# Patient Record
Sex: Male | Born: 1980 | Race: White | Hispanic: No | Marital: Married | Smoking: Current every day smoker
Health system: Southern US, Community
[De-identification: ages and names within clinical notes are randomized; demographics above are authoritative.]

## PROBLEM LIST (undated history)

## (undated) DIAGNOSIS — F329 Major depressive disorder, single episode, unspecified: Secondary | ICD-10-CM

## (undated) DIAGNOSIS — E079 Disorder of thyroid, unspecified: Secondary | ICD-10-CM

## (undated) DIAGNOSIS — F32A Depression, unspecified: Secondary | ICD-10-CM

## (undated) DIAGNOSIS — I10 Essential (primary) hypertension: Secondary | ICD-10-CM

## (undated) HISTORY — DX: Major depressive disorder, single episode, unspecified: F32.9

## (undated) HISTORY — DX: Depression, unspecified: F32.A

## (undated) HISTORY — DX: Disorder of thyroid, unspecified: E07.9

## (undated) HISTORY — DX: Essential (primary) hypertension: I10

---
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
## 2004-03-10 ENCOUNTER — Emergency Department: Payer: Self-pay | Admitting: Emergency Medicine

## 2009-09-04 ENCOUNTER — Emergency Department: Payer: Self-pay | Admitting: Emergency Medicine

## 2010-11-26 ENCOUNTER — Ambulatory Visit: Payer: Self-pay

## 2015-02-12 ENCOUNTER — Ambulatory Visit: Payer: Self-pay | Admitting: Physician Assistant

## 2015-02-19 ENCOUNTER — Ambulatory Visit (INDEPENDENT_AMBULATORY_CARE_PROVIDER_SITE_OTHER): Payer: Managed Care, Other (non HMO) | Admitting: Physician Assistant

## 2015-02-19 ENCOUNTER — Encounter: Payer: Self-pay | Admitting: Physician Assistant

## 2015-02-19 VITALS — BP 158/100 | HR 70 | Temp 98.5°F | Resp 18 | Ht 72.0 in | Wt 205.4 lb

## 2015-02-19 DIAGNOSIS — F32A Depression, unspecified: Secondary | ICD-10-CM | POA: Insufficient documentation

## 2015-02-19 DIAGNOSIS — F419 Anxiety disorder, unspecified: Secondary | ICD-10-CM | POA: Diagnosis not present

## 2015-02-19 DIAGNOSIS — F329 Major depressive disorder, single episode, unspecified: Secondary | ICD-10-CM | POA: Diagnosis not present

## 2015-02-19 DIAGNOSIS — I10 Essential (primary) hypertension: Secondary | ICD-10-CM

## 2015-02-19 DIAGNOSIS — Z7689 Persons encountering health services in other specified circumstances: Secondary | ICD-10-CM

## 2015-02-19 DIAGNOSIS — Z7189 Other specified counseling: Secondary | ICD-10-CM | POA: Diagnosis not present

## 2015-02-19 DIAGNOSIS — M7022 Olecranon bursitis, left elbow: Secondary | ICD-10-CM

## 2015-02-19 DIAGNOSIS — E079 Disorder of thyroid, unspecified: Secondary | ICD-10-CM

## 2015-02-19 MED ORDER — CITALOPRAM HYDROBROMIDE 20 MG PO TABS: 20.0000 mg | ORAL_TABLET | Freq: Every day | ORAL | Status: DC

## 2015-02-19 MED ORDER — ALPRAZOLAM 0.25 MG PO TABS: 0.2500 mg | ORAL_TABLET | Freq: Three times a day (TID) | ORAL | Status: DC | PRN

## 2015-02-19 NOTE — Patient Instructions (Signed)
Hypertension Hypertension, commonly called high blood pressure, is when the force of blood pumping through your arteries is too strong. Your arteries are the blood vessels that carry blood from your heart throughout your body. A blood pressure reading consists of a higher number over a lower number, such as 110/72. The higher number (systolic) is the pressure inside your arteries when your heart pumps. The lower number (diastolic) is the pressure inside your arteries when your heart relaxes. Ideally you want your blood pressure below 120/80. Hypertension forces your heart to work harder to pump blood. Your arteries may become narrow or stiff. Having untreated or uncontrolled hypertension can cause heart attack, stroke, kidney disease, and other problems. RISK FACTORS Some risk factors for high blood pressure are controllable. Others are not.  Risk factors you cannot control include:   Race. You may be at higher risk if you are African American.  Age. Risk increases with age.  Gender. Men are at higher risk than women before age 45 years. After age 65, women are at higher risk than men. Risk factors you can control include:  Not getting enough exercise or physical activity.  Being overweight.  Getting too much fat, sugar, calories, or salt in your diet.  Drinking too much alcohol. SIGNS AND SYMPTOMS Hypertension does not usually cause signs or symptoms. Extremely high blood pressure (hypertensive crisis) may cause headache, anxiety, shortness of breath, and nosebleed. DIAGNOSIS To check if you have hypertension, your health care provider will measure your blood pressure while you are seated, with your arm held at the level of your heart. It should be measured at least twice using the same arm. Certain conditions can cause a difference in blood pressure between your right and left arms. A blood pressure reading that is higher than normal on one occasion does not mean that you need treatment. If  it is not clear whether you have high blood pressure, you may be asked to return on a different day to have your blood pressure checked again. Or, you may be asked to monitor your blood pressure at home for 1 or more weeks. TREATMENT Treating high blood pressure includes making lifestyle changes and possibly taking medicine. Living a healthy lifestyle can help lower high blood pressure. You may need to change some of your habits. Lifestyle changes may include:  Following the DASH diet. This diet is high in fruits, vegetables, and whole grains. It is low in salt, red meat, and added sugars.  Keep your sodium intake below 2,300 mg per day.  Getting at least 30-45 minutes of aerobic exercise at least 4 times per week.  Losing weight if necessary.  Not smoking.  Limiting alcoholic beverages.  Learning ways to reduce stress. Your health care provider may prescribe medicine if lifestyle changes are not enough to get your blood pressure under control, and if one of the following is true:  You are 18-59 years of age and your systolic blood pressure is above 140.  You are 60 years of age or older, and your systolic blood pressure is above 150.  Your diastolic blood pressure is above 90.  You have diabetes, and your systolic blood pressure is over 140 or your diastolic blood pressure is over 90.  You have kidney disease and your blood pressure is above 140/90.  You have heart disease and your blood pressure is above 140/90. Your personal target blood pressure may vary depending on your medical conditions, your age, and other factors. HOME CARE INSTRUCTIONS    Have your blood pressure rechecked as directed by your health care provider.   Take medicines only as directed by your health care provider. Follow the directions carefully. Blood pressure medicines must be taken as prescribed. The medicine does not work as well when you skip doses. Skipping doses also puts you at risk for  problems.  Do not smoke.   Monitor your blood pressure at home as directed by your health care provider. SEEK MEDICAL CARE IF:   You think you are having a reaction to medicines taken.  You have recurrent headaches or feel dizzy.  You have swelling in your ankles.  You have trouble with your vision. SEEK IMMEDIATE MEDICAL CARE IF:  You develop a severe headache or confusion.  You have unusual weakness, numbness, or feel faint.  You have severe chest or abdominal pain.  You vomit repeatedly.  You have trouble breathing. MAKE SURE YOU:   Understand these instructions.  Will watch your condition.  Will get help right away if you are not doing well or get worse.   This information is not intended to replace advice given to you by your health care provider. Make sure you discuss any questions you have with your health care provider.   Document Released: 12/20/2004 Document Revised: 05/06/2014 Document Reviewed: 10/12/2012 Elsevier Interactive Patient Education 2016 [employer] DASH Eating Plan DASH stands for "Dietary Approaches to Stop Hypertension." The DASH eating plan is a healthy eating plan that has been shown to reduce high blood pressure (hypertension). Additional health benefits may include reducing the risk of type 2 diabetes mellitus, heart disease, and stroke. The DASH eating plan may also help with weight loss. WHAT DO I NEED TO KNOW ABOUT THE DASH EATING PLAN? For the DASH eating plan, you will follow these general guidelines:  Choose foods with a percent daily value for sodium of less than 5% (as listed on the food label).  Use salt-free seasonings or herbs instead of table salt or sea salt.  Check with your health care provider or pharmacist before using salt substitutes.  Eat lower-sodium products, often labeled as "lower sodium" or "no salt added."  Eat fresh foods.  Eat more vegetables, fruits, and low-fat dairy products.  Choose whole grains.  Look for the word "whole" as the first word in the ingredient list.  Choose fish and skinless chicken or turkey more often than red meat. Limit fish, poultry, and meat to 6 oz (170 g) each day.  Limit sweets, desserts, sugars, and sugary drinks.  Choose heart-healthy fats.  Limit cheese to 1 oz (28 g) per day.  Eat more home-cooked food and less restaurant, buffet, and fast food.  Limit fried foods.  Cook foods using methods other than frying.  Limit canned vegetables. If you do use them, rinse them well to decrease the sodium.  When eating at a restaurant, ask that your food be prepared with less salt, or no salt if possible. WHAT FOODS CAN I EAT? Seek help from a dietitian for individual calorie needs. Grains Whole grain or whole wheat bread. Brown rice. Whole grain or whole wheat pasta. Quinoa, bulgur, and whole grain cereals. Low-sodium cereals. Corn or whole wheat flour tortillas. Whole grain cornbread. Whole grain crackers. Low-sodium crackers. Vegetables Fresh or frozen vegetables (raw, steamed, roasted, or grilled). Low-sodium or reduced-sodium tomato and vegetable juices. Low-sodium or reduced-sodium tomato sauce and paste. Low-sodium or reduced-sodium canned vegetables.  Fruits All fresh, canned (in natural juice), or frozen fruits. Meat and Other   Protein Products Ground beef (85% or leaner), grass-fed beef, or beef trimmed of fat. Skinless chicken or [place]. Ground chicken or [place]. Pork trimmed of fat. All fish and seafood. Eggs. Dried beans, peas, or lentils. Unsalted nuts and seeds. Unsalted canned beans. Dairy Low-fat dairy products, such as skim or 1% milk, 2% or reduced-fat cheeses, low-fat ricotta or cottage cheese, or plain low-fat yogurt. Low-sodium or reduced-sodium cheeses. Fats and Oils Tub margarines without trans fats. Light or reduced-fat mayonnaise and salad dressings (reduced sodium). Avocado. Safflower, olive, or canola oils. Natural peanut or almond  butter. Other Unsalted popcorn and pretzels. The items listed above may not be a complete list of recommended foods or beverages. Contact your dietitian for more options. WHAT FOODS ARE NOT RECOMMENDED? Grains White bread. White pasta. White rice. Refined cornbread. Bagels and croissants. Crackers that contain trans fat. Vegetables Creamed or fried vegetables. Vegetables in a cheese sauce. Regular canned vegetables. Regular canned tomato sauce and paste. Regular tomato and vegetable juices. Fruits Dried fruits. Canned fruit in light or heavy syrup. Fruit juice. Meat and Other Protein Products Fatty cuts of meat. Ribs, chicken wings, bacon, sausage, bologna, salami, chitterlings, fatback, hot dogs, bratwurst, and packaged luncheon meats. Salted nuts and seeds. Canned beans with salt. Dairy Whole or 2% milk, cream, half-and-half, and cream cheese. Whole-fat or sweetened yogurt. Full-fat cheeses or blue cheese. Nondairy creamers and whipped toppings. Processed cheese, cheese spreads, or cheese curds. Condiments Onion and garlic salt, seasoned salt, table salt, and sea salt. Canned and packaged gravies. Worcestershire sauce. Tartar sauce. Barbecue sauce. Teriyaki sauce. Soy sauce, including reduced sodium. Steak sauce. Fish sauce. Oyster sauce. Cocktail sauce. Horseradish. Ketchup and mustard. Meat flavorings and tenderizers. Bouillon cubes. Hot sauce. Tabasco sauce. Marinades. Taco seasonings. Relishes. Fats and Oils Butter, stick margarine, lard, shortening, ghee, and bacon fat. Coconut, palm kernel, or palm oils. Regular salad dressings. Other Pickles and olives. Salted popcorn and pretzels. The items listed above may not be a complete list of foods and beverages to avoid. Contact your dietitian for more information. WHERE CAN I FIND MORE INFORMATION? National [employer], [employer], [employer]: [employer]   This information is not intended to replace  advice given to you by your health care provider. Make sure you discuss any questions you have with your health care provider.   Document Released: 12/09/2010 Document Revised: 01/10/2014 Document Reviewed: 10/24/2012 Elsevier Interactive Patient Education 2016 [employer] Citalopram tablets What is this medicine? CITALOPRAM (sye TAL oh pram) is a medicine for depression. This medicine may be used for other purposes; ask your health care provider or pharmacist if you have questions. What should I tell my health care provider before I take this medicine? They need to know if you have any of these conditions: -bipolar disorder or a family history of bipolar disorder -diabetes -glaucoma -heart disease -history of irregular heartbeat -kidney or liver disease -low levels of magnesium or potassium in the blood -receiving electroconvulsive therapy -seizures (convulsions) -suicidal thoughts or a previous suicide attempt -an unusual or allergic reaction to citalopram, escitalopram, other medicines, foods, dyes, or preservatives -pregnant or trying to become pregnant -breast-feeding How should I use this medicine? Take this medicine by mouth with a glass of water. Follow the directions on the prescription label. You can take it with or without food. Take your medicine at regular intervals. Do not take your medicine more often than directed. Do not stop taking this medicine suddenly except upon the advice of your doctor. Stopping this  medicine too quickly may cause serious side effects or your condition may worsen. A special MedGuide will be given to you by the pharmacist with each prescription and refill. Be sure to read this information carefully each time. Talk to your pediatrician regarding the use of this medicine in children. Special care may be needed. Patients over 55 years old may have a stronger reaction and need a smaller dose. Overdosage: If you think you have taken too much of this  medicine contact a poison control center or emergency room at once. NOTE: This medicine is only for you. Do not share this medicine with others. What if I miss a dose? If you miss a dose, take it as soon as you can. If it is almost time for your next dose, take only that dose. Do not take double or extra doses. What may interact with this medicine? Do not take this medicine with any of the following medications: -certain medicines for fungal infections like fluconazole, itraconazole, ketoconazole, posaconazole, voriconazole -cisapride -dofetilide -dronedarone -escitalopram -linezolid -MAOIs like Carbex, Eldepryl, Marplan, Nardil, and Parnate -methylene blue (injected into a vein) -pimozide -thioridazine -ziprasidone This medicine may also interact with the following medications: -alcohol -aspirin and aspirin-like medicines -carbamazepine -certain medicines for depression, anxiety, or psychotic disturbances -certain medicines for infections like chloroquine, clarithromycin, erythromycin, furazolidone, isoniazid, pentamidine -certain medicines for migraine headaches like almotriptan, eletriptan, frovatriptan, naratriptan, rizatriptan, sumatriptan, zolmitriptan -certain medicines for sleep -certain medicines that treat or prevent blood clots like dalteparin, enoxaparin, warfarin -cimetidine -diuretics -fentanyl -lithium -methadone -metoprolol -NSAIDs, medicines for pain and inflammation, like ibuprofen or naproxen -omeprazole -other medicines that prolong the QT interval (cause an abnormal heart rhythm) -procarbazine -rasagiline -supplements like St. John's wort, kava kava, valerian -tramadol -tryptophan This list may not describe all possible interactions. Give your health care provider a list of all the medicines, herbs, non-prescription drugs, or dietary supplements you use. Also tell them if you smoke, drink alcohol, or use illegal drugs. Some items may interact with your  medicine. What should I watch for while using this medicine? Tell your doctor if your symptoms do not get better or if they get worse. Visit your doctor or health care professional for regular checks on your progress. Because it may take several weeks to see the full effects of this medicine, it is important to continue your treatment as prescribed by your doctor. Patients and their families should watch out for new or worsening thoughts of suicide or depression. Also watch out for sudden changes in feelings such as feeling anxious, agitated, panicky, irritable, hostile, aggressive, impulsive, severely restless, overly excited and hyperactive, or not being able to sleep. If this happens, especially at the beginning of treatment or after a change in dose, call your health care professional. Bonita Quin may get drowsy or dizzy. Do not drive, use machinery, or do anything that needs mental alertness until you know how this medicine affects you. Do not stand or sit up quickly, especially if you are an older patient. This reduces the risk of dizzy or fainting spells. Alcohol may interfere with the effect of this medicine. Avoid alcoholic drinks. Your mouth may get dry. Chewing sugarless gum or sucking hard candy, and drinking plenty of water will help. Contact your doctor if the problem does not go away or is severe. What side effects may I notice from receiving this medicine? Side effects that you should report to your doctor or health care professional as soon as possible: -allergic reactions like skin rash,  itching or hives, swelling of the face, lips, or tongue -chest pain -confusion -dizziness -fast, irregular heartbeat -fast talking and excited feelings or actions that are out of control -feeling faint or lightheaded, falls -hallucination, loss of contact with reality -seizures -shortness of breath -suicidal thoughts or other mood changes -unusual bleeding or bruising Side effects that usually do not  require medical attention (report to your doctor or health care professional if they continue or are bothersome): -blurred vision -change in appetite -change in sex drive or performance -headache -increased sweating -nausea -trouble sleeping This list may not describe all possible side effects. Call your doctor for medical advice about side effects. You may report side effects to FDA at 1-800-FDA-1088. Where should I keep my medicine? Keep out of reach of children. Store at room temperature between 15 and 30 degrees C (59 and 86 degrees F). Throw away any unused medicine after the expiration date. NOTE: This sheet is a summary. It may not cover all possible information. If you have questions about this medicine, talk to your doctor, pharmacist, or health care provider.    2016, Elsevier/[employer]. (2012-07-13 13:19:48)

## 2015-02-19 NOTE — Progress Notes (Signed)
Subjective:     Patient ID: Christian Strong., male   DOB: 03-09-1980, 35 y.o.   MRN: [id number]   Establish Patient Care Patient comes to office today as a new patient to establish patient care he states that he has not had a PCP in 20 years.  Anxiety Presents for initial visit. Onset was 1 to 4 weeks ago. The problem has been unchanged. Symptoms include depressed mood, hyperventilation, insomnia, irritability, malaise, muscle tension, nervous/anxious behavior, palpitations, panic and restlessness. Patient reports no chest pain, compulsions, confusion, decreased concentration, dizziness, dry mouth, excessive worry, feeling of choking, impotence, nausea, obsessions, shortness of breath or suicidal ideas. Symptoms occur occasionally. The severity of symptoms is moderate. The symptoms are aggravated by family issues (Wife is out of country). The quality of sleep is poor. Nighttime awakenings: none.   Risk factors include a major life event. His past medical history is significant for anxiety/panic attacks and depression. Prior compliance problems include difficulty with treatment plan (patient states that he was on medication for issue as a teenager).   Wife is working Designer, jewellery for the next 2 months. Gets stressed and anxious about her safety and when she travels. Also gets very anxious with raising 2 teenage daughters alone.  Scalp Problem Patient reports that he shaves his head and about a week ago. He states that when he shaved he cut himself deeply and now has a bald spot on his head that has not improved. Wants to have it examined.   Review of Systems  Constitutional: Positive for irritability.  HENT: Negative.   Eyes: Negative.   Respiratory: Negative.  Negative for shortness of breath.   Cardiovascular: Positive for palpitations. Negative for chest pain.  Gastrointestinal: Negative.  Negative for nausea.  Endocrine: Negative.   Genitourinary: Negative.  Negative for impotence.   Musculoskeletal: Positive for joint swelling (left elbow).  Skin: Positive for wound (scalp).  Allergic/Immunologic: Negative.   Neurological: Positive for numbness (patient reports that he feels numbness when having panic attracks). Negative for dizziness.  Hematological: Negative.   Psychiatric/Behavioral: Negative for suicidal ideas, confusion and decreased concentration. The patient is nervous/anxious and has insomnia.      Patient Active Problem List   Diagnosis Date Noted  . Depression 02/19/2015  . Hypertension 02/19/2015   Past Medical History  Diagnosis Date  . Thyroid disease   . Depression    No current outpatient prescriptions on file prior to visit.   No current facility-administered medications on file prior to visit.   Allergies no known allergies Past Surgical History  Procedure Laterality Date  . 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 Marital Status: Married    Spouse Name: N/A  . Number of Children: N/A  . Years of Education: N/A   Occupational History  . Not on file.   Social History Main Topics  . Smoking status: Current Every Day Smoker -- 0.50 packs/day    Types: Cigarettes  . Smokeless tobacco: Never Used  . Alcohol Use: 1.8 - 2.4 oz/week    3-4 Shots of liquor per week  . Drug Use: Yes     Comment: previous history of use   . Sexual Activity:    Partners: Female   Other Topics Concern  . Not on file   Social History Narrative  . No narrative on file   Family History  Problem Relation Age of Onset  . Heart attack Mother   .  Heart disease Mother   . Diabetes Mother   . Cancer Mother   . Colon polyps Mother      .result Filed Vitals:   02/19/15 1501  BP: 160/90  Pulse: 70  Temp: 98.5 F (36.9 C)  Resp: 18       Objective:   Physical Exam  Constitutional: He is oriented to person, place, and time. He appears well-developed and well-nourished.  HENT:  Head: Normocephalic and atraumatic.     Right Ear: Tympanic membrane, external ear and ear canal normal.  Left Ear: Tympanic membrane, external ear and ear canal normal.  Nose: Nose normal.  Mouth/Throat: Uvula is midline, oropharynx is clear and moist and mucous membranes are normal. No oropharyngeal exudate, posterior oropharyngeal edema or posterior oropharyngeal erythema.  Eyes: Conjunctivae and EOM are normal. Pupils are equal, round, and reactive to light. Right eye exhibits no discharge.  Neck: Normal range of motion. Neck supple. No tracheal deviation present. No thyromegaly present.  Cardiovascular: Normal rate, regular rhythm, normal heart sounds and intact distal pulses.   No murmur heard. Pulmonary/Chest: Effort normal and breath sounds normal. No respiratory distress. He has no wheezes. He has no rales. He exhibits no tenderness.  Abdominal: Soft. He exhibits no distension and no mass. There is no tenderness. There is no rebound and no guarding.  Musculoskeletal: Normal range of motion. He exhibits no edema or tenderness.       Right elbow: Normal.      Left elbow: He exhibits swelling (inflamed bursa). He exhibits normal range of motion and no effusion. No tenderness found.  Lymphadenopathy:    He has no cervical adenopathy.  Neurological: He is alert and oriented to person, place, and time. He has normal reflexes. No cranial nerve deficit. He exhibits normal muscle tone. Coordination normal.  Skin: Skin is warm and dry. No rash noted. No erythema.     Psychiatric: His speech is normal and behavior is normal. Judgment and thought content normal. His mood appears anxious. Cognition and memory are normal.  Vitals reviewed.      Assessment:     1. Establishing care with new doctor, encounter for   2. Depression   3. Thyroid disease   4. Essential hypertension   5. Acute anxiety   6. Bursitis of elbow, left       Plan:     1. Establishing care with new doctor, encounter for No previous PCP.  2.  Depression Worsening symptoms secondary to situation. Will give Celexa as below. He is to call the office if he has any adverse reactions to the medication as discussed. I will see him back in 4 weeks if he tolerates the medication to see how he is doing at that time. - citalopram (CELEXA) 20 MG tablet; Take 1 tablet (20 mg total) by mouth daily.  Dispense: 30 tablet; Refill: 0  3. Thyroid disease States that when he was a teenager he had a thyroid nodule and thinks they removed approximately 75% of his thyroid. He cannot remember which side that it was on. He has not had his thyroid levels checked since. Labs as below and follow-up pending these lab results. - TSH  4. Essential hypertension Blood pressure was elevated today in the office. I will check labs as below and follow-up pending these lab results. I do feel his blood pressure may be elevated secondary to acute anxiety. He was very nervous and anxious appearing on exam today. I did advise him to check  his blood pressure at home a couple of times and to write those readings down and bring those with him when he returns in 4 weeks. If blood pressure is elevated at home and is elevated in the office we'll start medication to lower blood pressure. - CBC w/Diff/Platelet - Comprehensive Metabolic Panel (CMET) - Lipid Profile  5. Acute anxiety Worsening situational anxiety secondary to his wife being overseas. Will give Xanax as below and follow-up in 4 weeks. He is to call the office if symptoms worsen in the meantime. - ALPRAZolam (XANAX) 0.25 MG tablet; Take 1 tablet (0.25 mg total) by mouth 3 (three) times daily as needed for anxiety.  Dispense: 90 tablet; Refill: 0  6. Bursitis of elbow, left He did have acute bursitis of the left elbow. There was no surrounding erythema or warmth to the area. Advised to continue ibuprofen 800 mg 3 times daily and continue icing his elbow. I did advise him to do this for 2 more weeks. And then intermittently  as needed. He is to call the office if it worsens or if signs of infection develop. If not I will see him back in 4 weeks to see if there's been any improvement. If not may consider referral to orthopedics for further evaluation and possible drainage.

## 2015-03-19 ENCOUNTER — Ambulatory Visit: Payer: Managed Care, Other (non HMO) | Admitting: Physician Assistant

## 2021-03-31 ENCOUNTER — Inpatient Hospital Stay: Payer: Commercial Managed Care - PPO

## 2021-03-31 ENCOUNTER — Emergency Department: Payer: Commercial Managed Care - PPO

## 2021-03-31 ENCOUNTER — Other Ambulatory Visit: Payer: Self-pay

## 2021-03-31 ENCOUNTER — Inpatient Hospital Stay
Admission: EM | Admit: 2021-03-31 | Discharge: 2021-04-03 | DRG: 071 | Disposition: A | Discharge status: Home / Self Care | Payer: Commercial Managed Care - PPO | Attending: Student | Admitting: Student

## 2021-03-31 DIAGNOSIS — N179 Acute kidney failure, unspecified: Secondary | ICD-10-CM | POA: Diagnosis present

## 2021-03-31 DIAGNOSIS — E0590 Thyrotoxicosis, unspecified without thyrotoxic crisis or storm: Secondary | ICD-10-CM | POA: Diagnosis present

## 2021-03-31 DIAGNOSIS — Z20822 Contact with and (suspected) exposure to covid-19: Secondary | ICD-10-CM | POA: Diagnosis present

## 2021-03-31 DIAGNOSIS — G4730 Sleep apnea, unspecified: Secondary | ICD-10-CM | POA: Diagnosis present

## 2021-03-31 DIAGNOSIS — F17210 Nicotine dependence, cigarettes, uncomplicated: Secondary | ICD-10-CM | POA: Diagnosis present

## 2021-03-31 DIAGNOSIS — F32A Depression, unspecified: Secondary | ICD-10-CM | POA: Diagnosis present

## 2021-03-31 DIAGNOSIS — J328 Other chronic sinusitis: Secondary | ICD-10-CM | POA: Diagnosis present

## 2021-03-31 DIAGNOSIS — Z8249 Family history of ischemic heart disease and other diseases of the circulatory system: Secondary | ICD-10-CM | POA: Diagnosis not present

## 2021-03-31 DIAGNOSIS — R451 Restlessness and agitation: Secondary | ICD-10-CM | POA: Diagnosis present

## 2021-03-31 DIAGNOSIS — I10 Essential (primary) hypertension: Secondary | ICD-10-CM | POA: Diagnosis present

## 2021-03-31 DIAGNOSIS — M6282 Rhabdomyolysis: Secondary | ICD-10-CM | POA: Diagnosis present

## 2021-03-31 DIAGNOSIS — F419 Anxiety disorder, unspecified: Secondary | ICD-10-CM | POA: Diagnosis present

## 2021-03-31 DIAGNOSIS — I6783 Posterior reversible encephalopathy syndrome: Secondary | ICD-10-CM | POA: Diagnosis present

## 2021-03-31 DIAGNOSIS — I161 Hypertensive emergency: Secondary | ICD-10-CM | POA: Diagnosis present

## 2021-03-31 DIAGNOSIS — Z79899 Other long term (current) drug therapy: Secondary | ICD-10-CM | POA: Diagnosis not present

## 2021-03-31 DIAGNOSIS — E872 Acidosis, unspecified: Secondary | ICD-10-CM | POA: Diagnosis present

## 2021-03-31 LAB — DIFFERENTIAL
Abs Immature Granulocytes: 0.19 10*3/uL — ABNORMAL HIGH (ref 0.00–0.07)
Basophils Absolute: 0.1 10*3/uL (ref 0.0–0.1)
Basophils Relative: 1 %
Eosinophils Absolute: 0 10*3/uL (ref 0.0–0.5)
Eosinophils Relative: 0 %
Immature Granulocytes: 1 %
Lymphocytes Relative: 11 %
Lymphs Abs: 1.6 10*3/uL (ref 0.7–4.0)
Monocytes Absolute: 0.5 10*3/uL (ref 0.1–1.0)
Monocytes Relative: 3 %
Neutro Abs: 13 10*3/uL — ABNORMAL HIGH (ref 1.7–7.7)
Neutrophils Relative %: 84 %

## 2021-03-31 LAB — URINALYSIS, ROUTINE W REFLEX MICROSCOPIC
Bilirubin Urine: NEGATIVE
Glucose, UA: NEGATIVE mg/dL
Ketones, ur: NEGATIVE mg/dL
Leukocytes,Ua: NEGATIVE
Nitrite: NEGATIVE
Protein, ur: 30 mg/dL — AB
Specific Gravity, Urine: 1.006 (ref 1.005–1.030)
pH: 5 (ref 5.0–8.0)

## 2021-03-31 LAB — URINE DRUG SCREEN, QUALITATIVE (ARMC ONLY)
Amphetamines, Ur Screen: NOT DETECTED
Barbiturates, Ur Screen: NOT DETECTED
Benzodiazepine, Ur Scrn: POSITIVE — AB
Cannabinoid 50 Ng, Ur ~~LOC~~: POSITIVE — AB
Cocaine Metabolite,Ur ~~LOC~~: NOT DETECTED
MDMA (Ecstasy)Ur Screen: NOT DETECTED
Methadone Scn, Ur: NOT DETECTED
Opiate, Ur Screen: NOT DETECTED
Phencyclidine (PCP) Ur S: NOT DETECTED
Tricyclic, Ur Screen: NOT DETECTED

## 2021-03-31 LAB — LIPID PANEL
Cholesterol: 241 mg/dL — ABNORMAL HIGH (ref 0–200)
HDL: 43 mg/dL (ref >40)
LDL Cholesterol: 173 mg/dL — ABNORMAL HIGH (ref 0–99)
Total CHOL/HDL Ratio: 5.6 RATIO
Triglycerides: 124 mg/dL (ref <150)
VLDL: 25 mg/dL (ref 0–40)

## 2021-03-31 LAB — BASIC METABOLIC PANEL
Anion gap: 11 (ref 5–15)
BUN: 16 mg/dL (ref 6–20)
CO2: 21 mmol/L — ABNORMAL LOW (ref 22–32)
Calcium: 9.2 mg/dL (ref 8.9–10.3)
Chloride: 105 mmol/L (ref 98–111)
Creatinine, Ser: 1.04 mg/dL (ref 0.61–1.24)
GFR, Estimated: >60 mL/min (ref >60)
Glucose, Bld: 118 mg/dL — ABNORMAL HIGH (ref 70–99)
Potassium: 3.7 mmol/L (ref 3.5–5.1)
Sodium: 137 mmol/L (ref 135–145)

## 2021-03-31 LAB — MAGNESIUM: Magnesium: 2.5 mg/dL — ABNORMAL HIGH (ref 1.7–2.4)

## 2021-03-31 LAB — BLOOD GAS, VENOUS
Acid-base deficit: 3.3 mmol/L — ABNORMAL HIGH (ref 0.0–2.0)
Bicarbonate: 22.1 mmol/L (ref 20.0–28.0)
O2 Saturation: 72.9 %
Patient temperature: 37
pCO2, Ven: 40 mmHg — ABNORMAL LOW (ref 44–60)
pH, Ven: 7.35 (ref 7.25–7.43)
pO2, Ven: 44 mmHg (ref 32–45)

## 2021-03-31 LAB — CBC
HCT: 48 % (ref 39.0–52.0)
Hemoglobin: 16 g/dL (ref 13.0–17.0)
MCH: 27.2 pg (ref 26.0–34.0)
MCHC: 33.3 g/dL (ref 30.0–36.0)
MCV: 81.6 fL (ref 80.0–100.0)
Platelets: 311 10*3/uL (ref 150–400)
RBC: 5.88 MIL/uL — ABNORMAL HIGH (ref 4.22–5.81)
RDW: 12.6 % (ref 11.5–15.5)
WBC: 15.4 10*3/uL — ABNORMAL HIGH (ref 4.0–10.5)
nRBC: 0 % (ref 0.0–0.2)

## 2021-03-31 LAB — COMPREHENSIVE METABOLIC PANEL
ALT: 60 U/L — ABNORMAL HIGH (ref 0–44)
AST: 56 U/L — ABNORMAL HIGH (ref 15–41)
Albumin: 4.4 g/dL (ref 3.5–5.0)
Alkaline Phosphatase: 92 U/L (ref 38–126)
Anion gap: 19 — ABNORMAL HIGH (ref 5–15)
BUN: 15 mg/dL (ref 6–20)
CO2: 15 mmol/L — ABNORMAL LOW (ref 22–32)
Calcium: 9.1 mg/dL (ref 8.9–10.3)
Chloride: 105 mmol/L (ref 98–111)
Creatinine, Ser: 1.15 mg/dL (ref 0.61–1.24)
GFR, Estimated: >60 mL/min (ref >60)
Glucose, Bld: 176 mg/dL — ABNORMAL HIGH (ref 70–99)
Potassium: 3.9 mmol/L (ref 3.5–5.1)
Sodium: 139 mmol/L (ref 135–145)
Total Bilirubin: 0.7 mg/dL (ref 0.3–1.2)
Total Protein: 8.1 g/dL (ref 6.5–8.1)

## 2021-03-31 LAB — LACTIC ACID, PLASMA
Lactic Acid, Venous: 2.8 mmol/L (ref 0.5–1.9)
Lactic Acid, Venous: 1.1 mmol/L (ref 0.5–1.9)

## 2021-03-31 LAB — APTT: aPTT: 27 seconds (ref 24–36)

## 2021-03-31 LAB — PROTIME-INR
INR: 1.1 (ref 0.8–1.2)
Prothrombin Time: 14 seconds (ref 11.4–15.2)

## 2021-03-31 LAB — MRSA NEXT GEN BY PCR, NASAL: MRSA by PCR Next Gen: NOT DETECTED

## 2021-03-31 LAB — RESP PANEL BY RT-PCR (FLU A&B, COVID) ARPGX2
Influenza A by PCR: NEGATIVE
Influenza B by PCR: NEGATIVE
SARS Coronavirus 2 by RT PCR: NEGATIVE

## 2021-03-31 LAB — GLUCOSE, CAPILLARY: Glucose-Capillary: 108 mg/dL — ABNORMAL HIGH (ref 70–99)

## 2021-03-31 LAB — CK: Total CK: 890 U/L — ABNORMAL HIGH (ref 49–397)

## 2021-03-31 LAB — PHOSPHORUS: Phosphorus: 3.6 mg/dL (ref 2.5–4.6)

## 2021-03-31 LAB — TROPONIN I (HIGH SENSITIVITY)
Troponin I (High Sensitivity): 133 ng/L (ref <18)
Troponin I (High Sensitivity): 162 ng/L (ref <18)

## 2021-03-31 LAB — ETHANOL: Alcohol, Ethyl (B): <10 mg/dL (ref <10)

## 2021-03-31 LAB — HIV ANTIBODY (ROUTINE TESTING W REFLEX): HIV Screen 4th Generation wRfx: NONREACTIVE

## 2021-03-31 LAB — SALICYLATE LEVEL: Salicylate Lvl: <7 mg/dL — ABNORMAL LOW (ref 7.0–30.0)

## 2021-03-31 IMAGING — MR MR MRA HEAD W/O CM
1 series · 16 of 48 positions shown · non-contrast
Comparison: Head CT [3W] hours today. Report of brain MRI
[DATE] (no images available).
COMPARISON: Head CT [3W] hours today. Report of brain MRI
[DATE] (no images available).

Addendum:
CLINICAL DATA: 41-year-old male with sudden severe headache. Code
stroke presentation. Asymmetric density at the left caudate felt to
be DVA on plain head CT this morning.

EXAM:
MRI HEAD WITHOUT CONTRAST
MRA HEAD WITHOUT CONTRAST
TECHNIQUE: Multiplanar, multi-echo pulse sequences of the brain and surrounding
structures were acquired without intravenous contrast. Angiographic
images of the Circle of Willis were acquired using MRA technique
without intravenous contrast.

[Series 7: TOF · axial · 0.5mm · 0.41mm/px · z∈[+3,+98]mm · 16 of 205 slices shown]
[im 1/205]
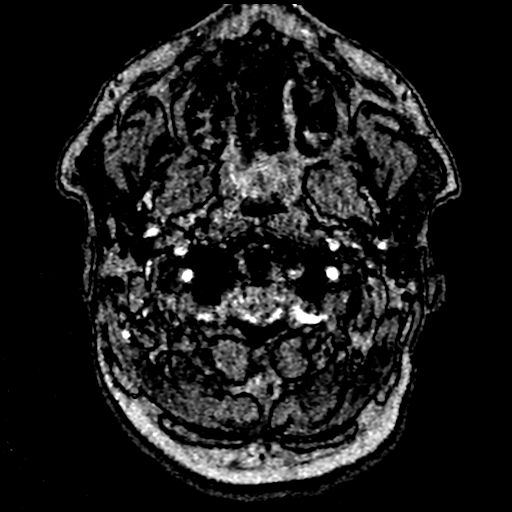
[im 5/205]
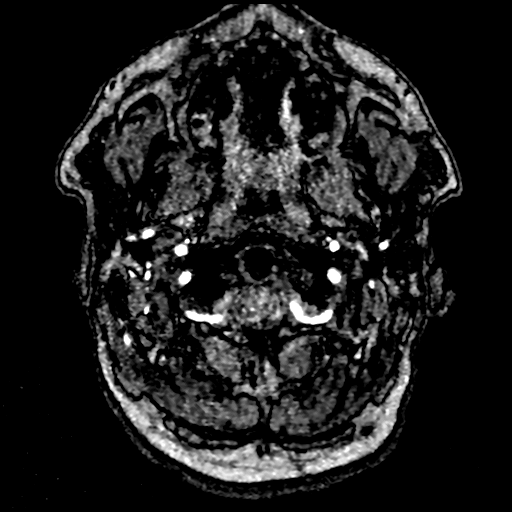
[im 9/205]
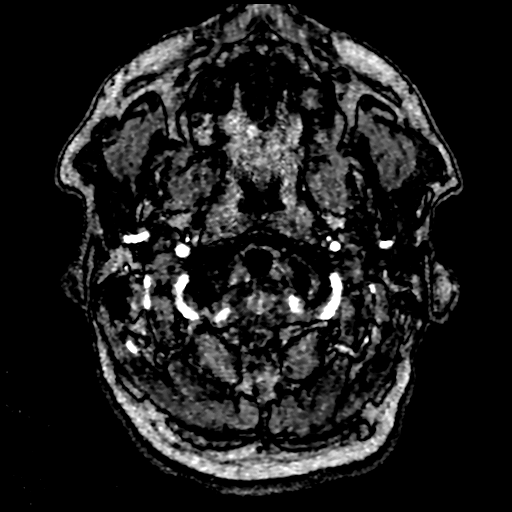
[im 14/205]
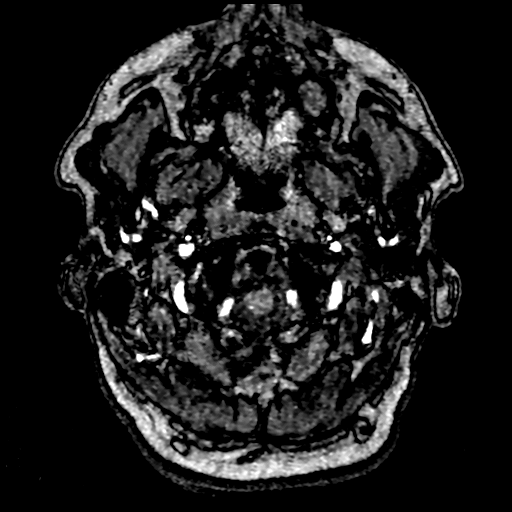
[im 18/205]
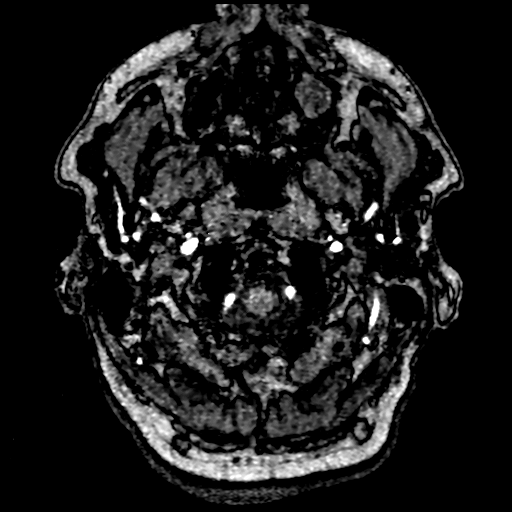
[im 22/205]
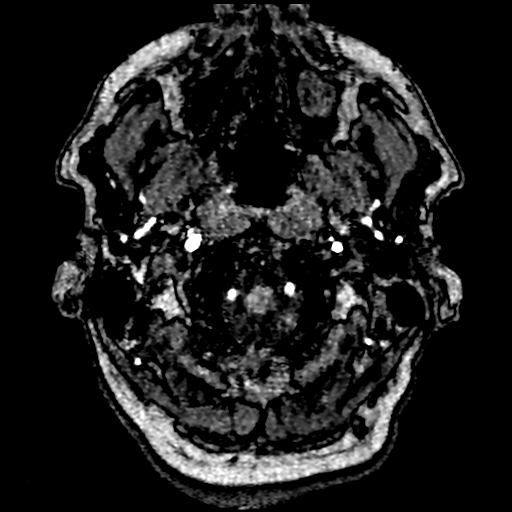
[im 35/205]
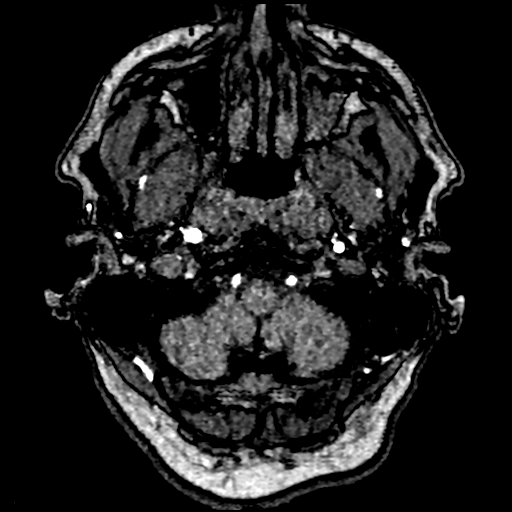
[im 40/205]
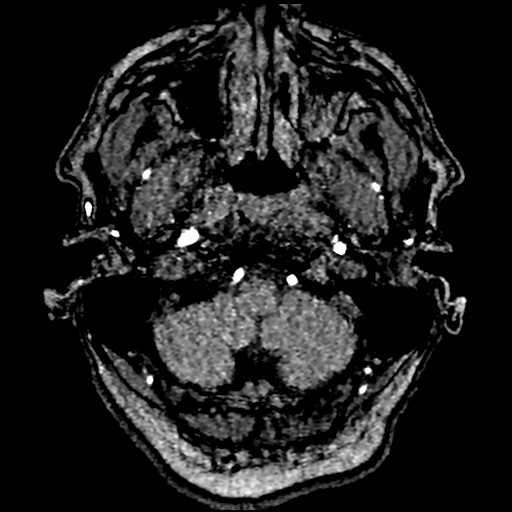
[im 66/205]
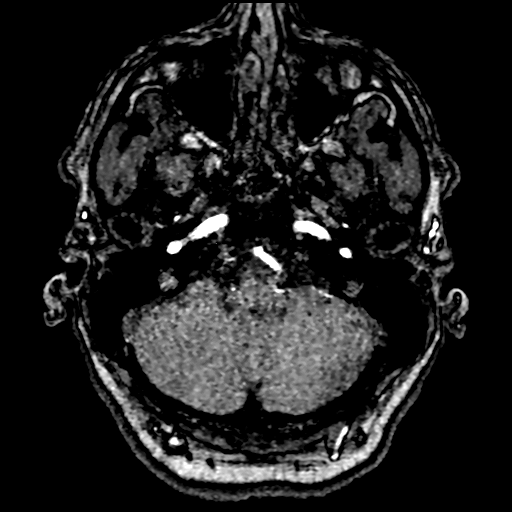
[im 92/205]
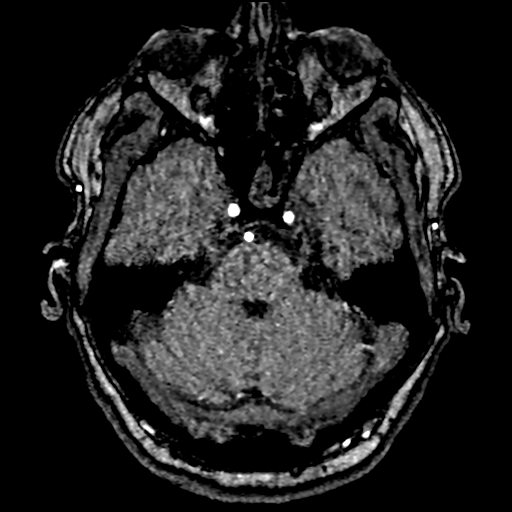
[im 105/205]
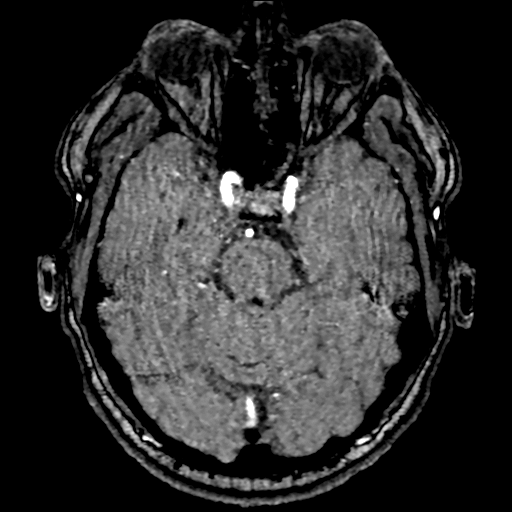
[im 118/205]
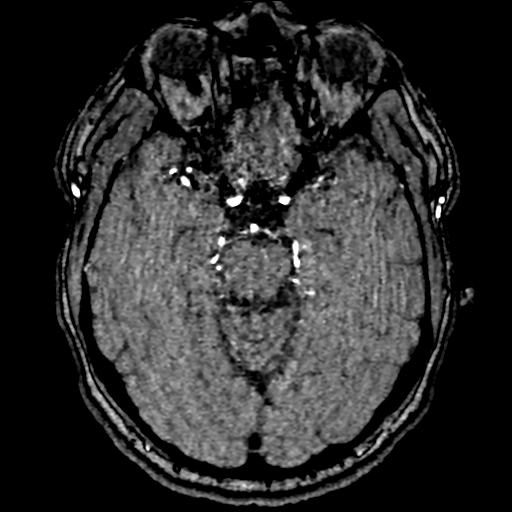
[im 144/205]
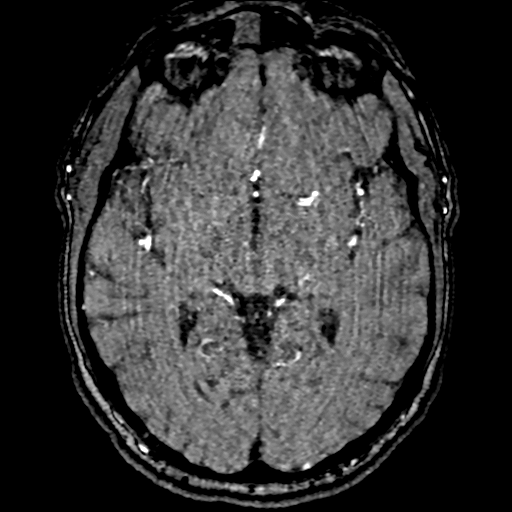
[im 170/205]
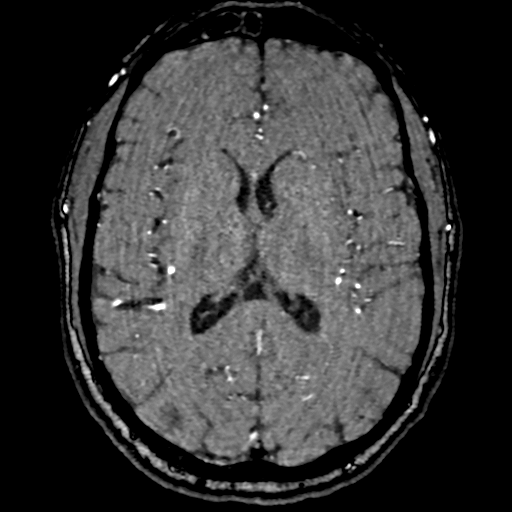
[im 174/205]
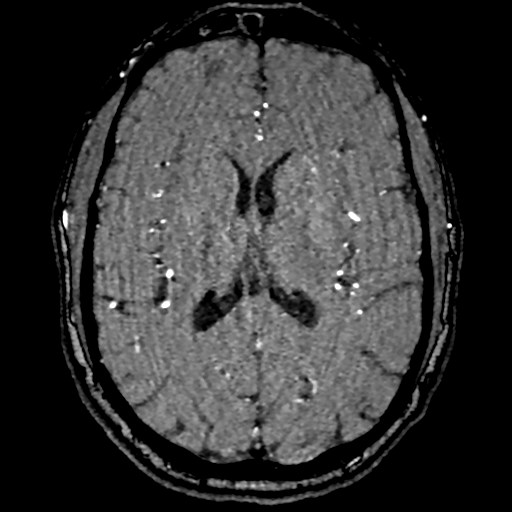
[im 196/205]
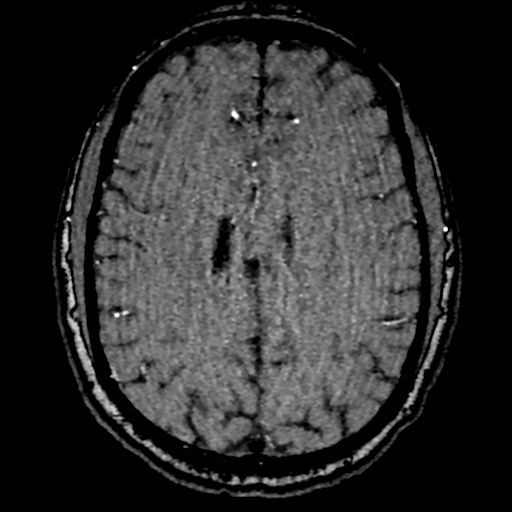

[16 of 48 positions shown; findings below may reference images not displayed]

FINDINGS: MRI HEAD FINDINGS

Brain: Susceptibility artifact on DWI and SWI corresponding to the
curvilinear hyperdensity on the earlier CT, and following contrast
this is a developmental venous anomaly (DVA, normal variant) with no
mass effect or cerebral edema.

No superimposed restricted diffusion to suggest acute infarction. No
midline shift, mass effect, evidence of mass lesion,
ventriculomegaly, extra-axial collection or acute intracranial
hemorrhage. Cervicomedullary junction and pituitary are within
normal limits.

Gray and white matter signal is within normal limits throughout the
brain. No cortical encephalomalacia or chronic cerebral blood
products. No abnormal enhancement identified. No dural thickening.

Vascular: Major intracranial vascular flow voids are preserved, see
MRA below.

Skull and upper cervical spine: Negative visible cervical spine.
Visualized bone marrow signal is within normal limits.

Sinuses/Orbits: Mild to moderate paranasal sinus mucosal thickening
unchanged from the earlier CT and scattered bilaterally. Orbits
appear negative.

Other: Mastoids are clear.  Negative visible scalp and face.

MRA HEAD FINDINGS

Anterior circulation: Antegrade flow in both ICA siphons. Right ICA
suspects that the right ICA appears slightly dominant along with the
right ACA A1. No siphon stenosis. Patent MCA and ACA origins.
Anterior communicating artery and visible ACA branches within normal
limits, median artery of the corpus callosum (normal variant). The
left MCA bifurcates early without stenosis. Right MCA M1 and
trifurcation appear patent without stenosis. Visible bilateral MCA
branches are within normal limits.

Posterior circulation: Antegrade flow in the posterior circulation
with codominant distal vertebral arteries. No distal vertebral or
basilar stenosis. Patent AICA, SCA and PCA origins. Posterior
communicating arteries are diminutive or absent. Bilateral PCA
branches are within normal limits.

Anatomic variants: Dominant right ICA and ACA A1.
IMPRESSION: 1. Confirmed developmental venous anomaly (normal variant). No acute
intracranial abnormality and normal noncontrast MRI appearance of
the Brain.
2. Negative intracranial MRA.
3. Mild to moderate bilateral paranasal sinus inflammation.

ADDENDUM:
Study discussed by telephone on [DATE] at [DATE] with Dr. ANALILI
ANALILI.

He questions the presence of subtle abnormal cortical FLAIR
hyperintensity in both occipital poles (such as series 14, images
21-23). This cannot be correlated on the T2 weighted imaging,
although propeller T2 axial and coronal were performed and are of
decreased sensitivity. Also difficult to correlate with facilitated
diffusion on the ADC map.

But still, he advises the patient has been hypertensive AND had
seizure-like activity, in addition to headache.
CONCLUSION: Suspicious for mild posterior reversible encephalopathy syndrome
(PRES) in the occipital poles.

*** End of Addendum ***
FINDINGS: MRI HEAD FINDINGS

Brain: Susceptibility artifact on DWI and SWI corresponding to the
curvilinear hyperdensity on the earlier CT, and following contrast
this is a developmental venous anomaly (DVA, normal variant) with no
mass effect or cerebral edema.

No superimposed restricted diffusion to suggest acute infarction. No
midline shift, mass effect, evidence of mass lesion,
ventriculomegaly, extra-axial collection or acute intracranial
hemorrhage. Cervicomedullary junction and pituitary are within
normal limits.

Gray and white matter signal is within normal limits throughout the
brain. No cortical encephalomalacia or chronic cerebral blood
products. No abnormal enhancement identified. No dural thickening.

Vascular: Major intracranial vascular flow voids are preserved, see
MRA below.

Skull and upper cervical spine: Negative visible cervical spine.
Visualized bone marrow signal is within normal limits.

Sinuses/Orbits: Mild to moderate paranasal sinus mucosal thickening
unchanged from the earlier CT and scattered bilaterally. Orbits
appear negative.

Other: Mastoids are clear.  Negative visible scalp and face.

MRA HEAD FINDINGS

Anterior circulation: Antegrade flow in both ICA siphons. Right ICA
suspects that the right ICA appears slightly dominant along with the
right ACA A1. No siphon stenosis. Patent MCA and ACA origins.
Anterior communicating artery and visible ACA branches within normal
limits, median artery of the corpus callosum (normal variant). The
left MCA bifurcates early without stenosis. Right MCA M1 and
trifurcation appear patent without stenosis. Visible bilateral MCA
branches are within normal limits.

Posterior circulation: Antegrade flow in the posterior circulation
with codominant distal vertebral arteries. No distal vertebral or
basilar stenosis. Patent AICA, SCA and PCA origins. Posterior
communicating arteries are diminutive or absent. Bilateral PCA
branches are within normal limits.

Anatomic variants: Dominant right ICA and ACA A1.
IMPRESSION: 1. Confirmed developmental venous anomaly (normal variant). No acute
intracranial abnormality and normal noncontrast MRI appearance of
the Brain.
2. Negative intracranial MRA.
3. Mild to moderate bilateral paranasal sinus inflammation.

## 2021-03-31 IMAGING — MR MR HEAD WO/W CM
16 series · 48 of 48 positions shown · non-contrast
Comparison: Head CT [3W] hours today. Report of brain MRI
[DATE] (no images available).
COMPARISON: Head CT [3W] hours today. Report of brain MRI
[DATE] (no images available).

Addendum:
CLINICAL DATA: 41-year-old male with sudden severe headache. Code
stroke presentation. Asymmetric density at the left caudate felt to
be DVA on plain head CT this morning.

EXAM:
MRI HEAD WITHOUT CONTRAST
MRA HEAD WITHOUT CONTRAST
TECHNIQUE: Multiplanar, multi-echo pulse sequences of the brain and surrounding
structures were acquired without intravenous contrast. Angiographic
images of the Circle of Willis were acquired using MRA technique
without intravenous contrast.

[Series 5: ax dwi_tracew · axial · 3.0mm · 1.80mm/px · z∈[+10,+161]mm · 4 of 48 slices shown]
[im 1/48]
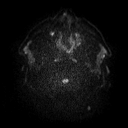
[im 16/48]
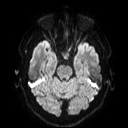
[im 32/48]
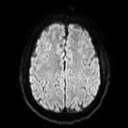
[im 48/48]
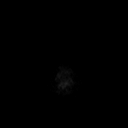

[Series 6: ax dwi_adc · axial · 3.0mm · 1.80mm/px · z∈[+10,+161]mm · 4 of 48 slices shown]
[im 1/48]
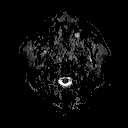
[im 16/48]
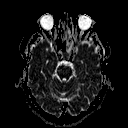
[im 32/48]
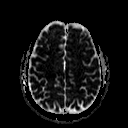
[im 48/48]
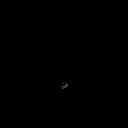

[Series 12: cor dwi_tracew · coronal · 5.0mm · 1.80mm/px · 3 of 38 slices shown]
[im 1/38]
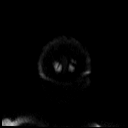
[im 19/38]
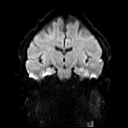
[im 38/38]
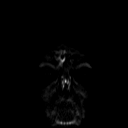

[Series 13: cor dwi_adc · coronal · 5.0mm · 1.80mm/px · 3 of 38 slices shown]
[im 1/38]
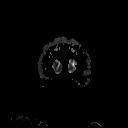
[im 19/38]
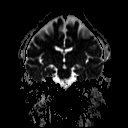
[im 38/38]
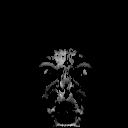

[Series 14: FLAIR · axial · 3.0mm · 0.69mm/px · z∈[+6,+164]mm · 4 of 55 slices shown]
[im 1/55]
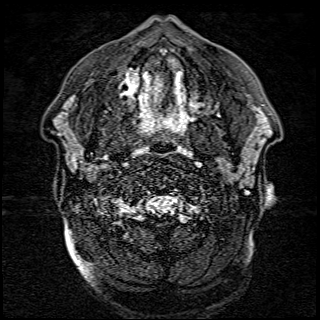
[im 19/55]
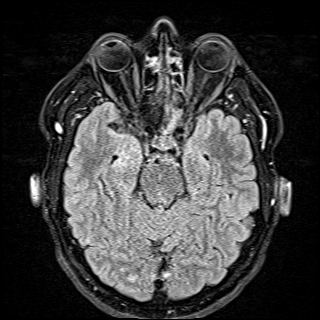
[im 37/55]
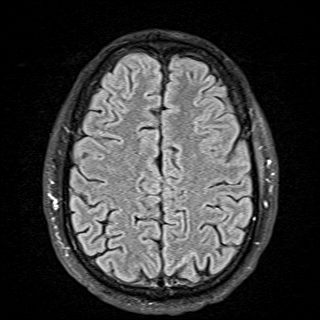
[im 55/55]
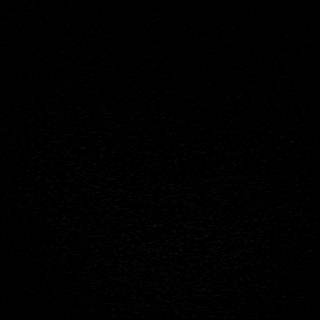

[Series 15: mag_images · axial · 3.0mm · 0.90mm/px · z∈[+12,+161]mm · 4 of 52 slices shown]
[im 1/52]
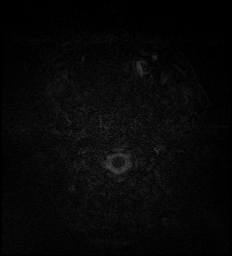
[im 18/52]
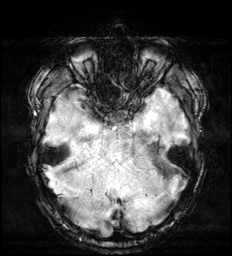
[im 35/52]
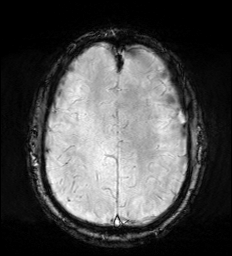
[im 52/52]
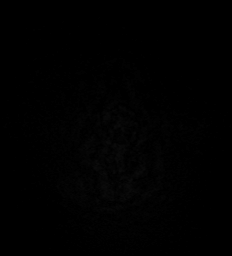

[Series 16: pha_images · axial · 3.0mm · 0.90mm/px · z∈[+12,+161]mm · 4 of 52 slices shown]
[im 1/52]
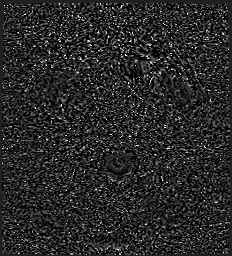
[im 18/52]
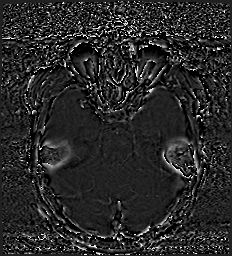
[im 35/52]
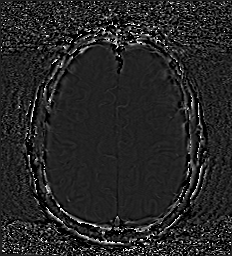
[im 52/52]
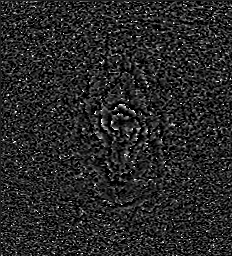

[Series 17: swi_images · axial · 3.0mm · 0.90mm/px · z∈[+12,+161]mm · 4 of 52 slices shown]
[im 1/52]
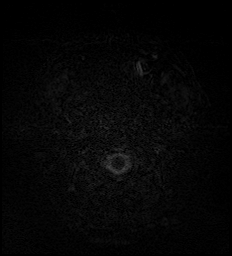
[im 18/52]
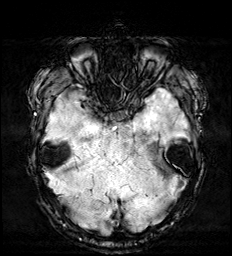
[im 35/52]
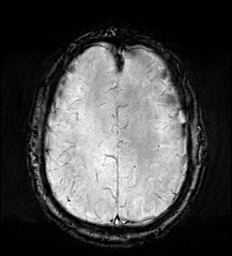
[im 52/52]
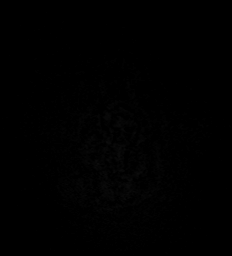

[Series 18: mip_images(sw) · axial · 24.0mm · 0.90mm/px · z∈[+22,+151]mm · 4 of 45 slices shown]
[im 1/45]
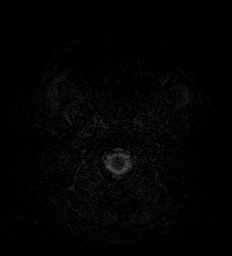
[im 15/45]
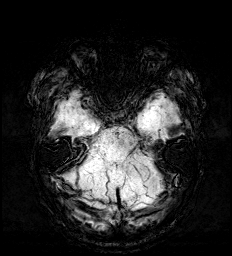
[im 30/45]
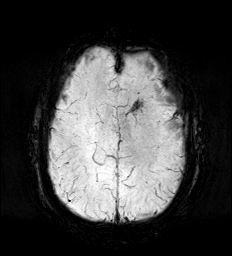
[im 45/45]
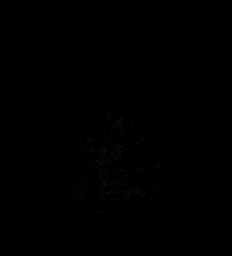

[Series 19: T1 · sagittal · 5.0mm · 0.94mm/px · 2 of 23 slices shown (1 of 3)]
[im 1/23]
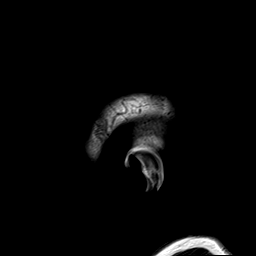
[im 23/23]
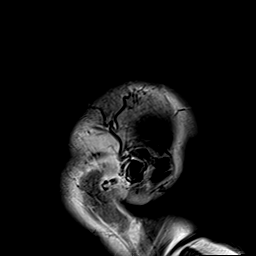

[Series 20: T2 · axial · 5.0mm · 0.45mm/px · z∈[+11,+163]mm · 2 of 27 slices shown (1 of 2)]
[im 1/27]
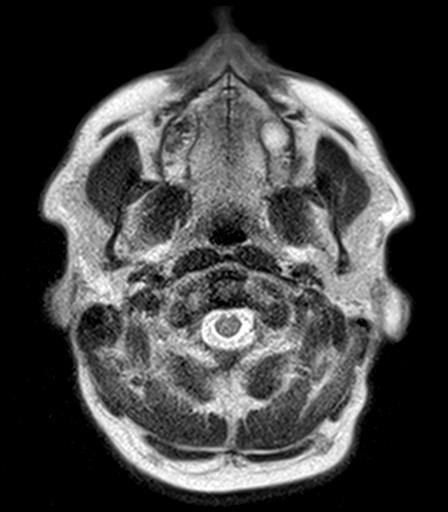
[im 27/27]
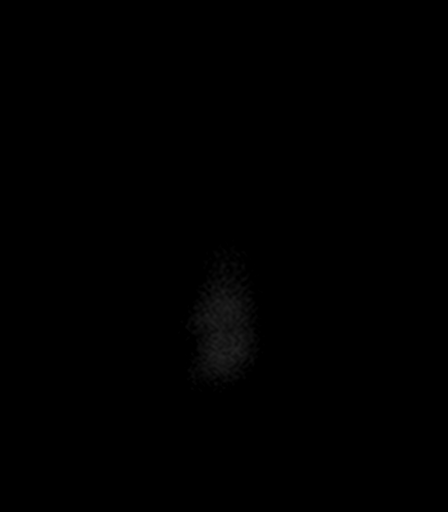

[Series 21: T1 · axial · 5.0mm · 0.90mm/px · z∈[+11,+163]mm · 2 of 27 slices shown (2 of 3)]
[im 1/27]
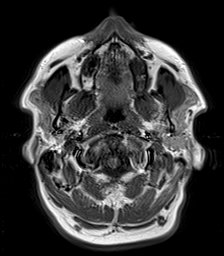
[im 27/27]
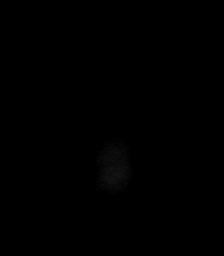

[Series 22: T2 · coronal · 5.0mm · 0.45mm/px · 2 of 31 slices shown (2 of 2)]
[im 1/31]
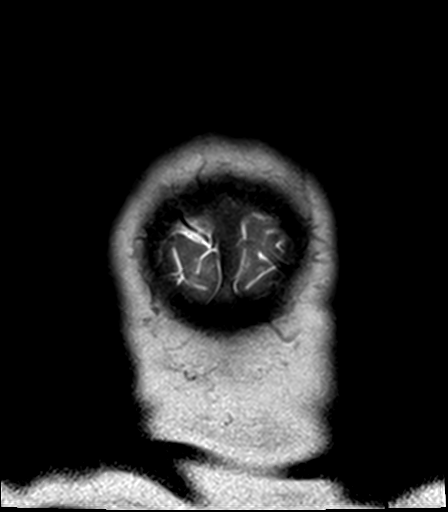
[im 31/31]
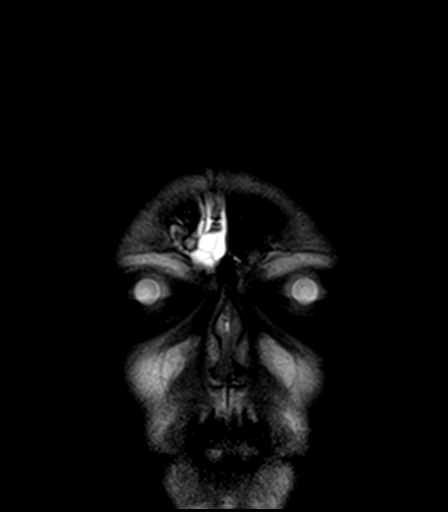

[Series 23: T1 post-contrast · axial · 5.0mm · 0.90mm/px · z∈[+11,+163]mm · 2 of 27 slices shown (1 of 2)]
[im 1/27]
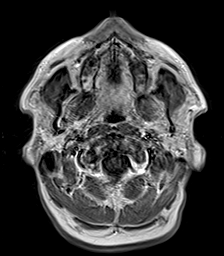
[im 27/27]
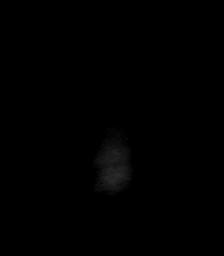

[Series 24: T1 post-contrast · coronal · 5.0mm · 0.90mm/px · 2 of 31 slices shown (2 of 2)]
[im 1/31]
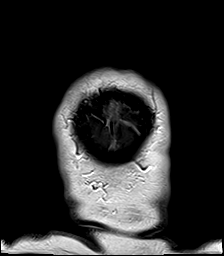
[im 31/31]
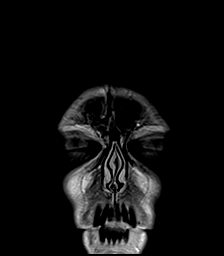

[Series 25: T1 · sagittal · 5.0mm · 0.94mm/px · 2 of 23 slices shown (3 of 3)]
[im 1/23]
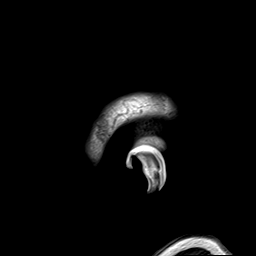
[im 23/23]
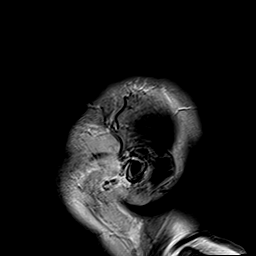

[48 of 48 positions shown; findings below may reference images not displayed]

FINDINGS: MRI HEAD FINDINGS

Brain: Susceptibility artifact on DWI and SWI corresponding to the
curvilinear hyperdensity on the earlier CT, and following contrast
this is a developmental venous anomaly (DVA, normal variant) with no
mass effect or cerebral edema.

No superimposed restricted diffusion to suggest acute infarction. No
midline shift, mass effect, evidence of mass lesion,
ventriculomegaly, extra-axial collection or acute intracranial
hemorrhage. Cervicomedullary junction and pituitary are within
normal limits.

Gray and white matter signal is within normal limits throughout the
brain. No cortical encephalomalacia or chronic cerebral blood
products. No abnormal enhancement identified. No dural thickening.

Vascular: Major intracranial vascular flow voids are preserved, see
MRA below.

Skull and upper cervical spine: Negative visible cervical spine.
Visualized bone marrow signal is within normal limits.

Sinuses/Orbits: Mild to moderate paranasal sinus mucosal thickening
unchanged from the earlier CT and scattered bilaterally. Orbits
appear negative.

Other: Mastoids are clear.  Negative visible scalp and face.

MRA HEAD FINDINGS

Anterior circulation: Antegrade flow in both ICA siphons. Right ICA
suspects that the right ICA appears slightly dominant along with the
right ACA A1. No siphon stenosis. Patent MCA and ACA origins.
Anterior communicating artery and visible ACA branches within normal
limits, median artery of the corpus callosum (normal variant). The
left MCA bifurcates early without stenosis. Right MCA M1 and
trifurcation appear patent without stenosis. Visible bilateral MCA
branches are within normal limits.

Posterior circulation: Antegrade flow in the posterior circulation
with codominant distal vertebral arteries. No distal vertebral or
basilar stenosis. Patent AICA, SCA and PCA origins. Posterior
communicating arteries are diminutive or absent. Bilateral PCA
branches are within normal limits.

Anatomic variants: Dominant right ICA and ACA A1.
IMPRESSION: 1. Confirmed developmental venous anomaly (normal variant). No acute
intracranial abnormality and normal noncontrast MRI appearance of
the Brain.
2. Negative intracranial MRA.
3. Mild to moderate bilateral paranasal sinus inflammation.

ADDENDUM:
Study discussed by telephone on [DATE] at [DATE] with Dr. ANALILI
ANALILI.

He questions the presence of subtle abnormal cortical FLAIR
hyperintensity in both occipital poles (such as series 14, images
21-23). This cannot be correlated on the T2 weighted imaging,
although propeller T2 axial and coronal were performed and are of
decreased sensitivity. Also difficult to correlate with facilitated
diffusion on the ADC map.

But still, he advises the patient has been hypertensive AND had
seizure-like activity, in addition to headache.
CONCLUSION: Suspicious for mild posterior reversible encephalopathy syndrome
(PRES) in the occipital poles.

*** End of Addendum ***
FINDINGS: MRI HEAD FINDINGS

Brain: Susceptibility artifact on DWI and SWI corresponding to the
curvilinear hyperdensity on the earlier CT, and following contrast
this is a developmental venous anomaly (DVA, normal variant) with no
mass effect or cerebral edema.

No superimposed restricted diffusion to suggest acute infarction. No
midline shift, mass effect, evidence of mass lesion,
ventriculomegaly, extra-axial collection or acute intracranial
hemorrhage. Cervicomedullary junction and pituitary are within
normal limits.

Gray and white matter signal is within normal limits throughout the
brain. No cortical encephalomalacia or chronic cerebral blood
products. No abnormal enhancement identified. No dural thickening.

Vascular: Major intracranial vascular flow voids are preserved, see
MRA below.

Skull and upper cervical spine: Negative visible cervical spine.
Visualized bone marrow signal is within normal limits.

Sinuses/Orbits: Mild to moderate paranasal sinus mucosal thickening
unchanged from the earlier CT and scattered bilaterally. Orbits
appear negative.

Other: Mastoids are clear.  Negative visible scalp and face.

MRA HEAD FINDINGS

Anterior circulation: Antegrade flow in both ICA siphons. Right ICA
suspects that the right ICA appears slightly dominant along with the
right ACA A1. No siphon stenosis. Patent MCA and ACA origins.
Anterior communicating artery and visible ACA branches within normal
limits, median artery of the corpus callosum (normal variant). The
left MCA bifurcates early without stenosis. Right MCA M1 and
trifurcation appear patent without stenosis. Visible bilateral MCA
branches are within normal limits.

Posterior circulation: Antegrade flow in the posterior circulation
with codominant distal vertebral arteries. No distal vertebral or
basilar stenosis. Patent AICA, SCA and PCA origins. Posterior
communicating arteries are diminutive or absent. Bilateral PCA
branches are within normal limits.

Anatomic variants: Dominant right ICA and ACA A1.
IMPRESSION: 1. Confirmed developmental venous anomaly (normal variant). No acute
intracranial abnormality and normal noncontrast MRI appearance of
the Brain.
2. Negative intracranial MRA.
3. Mild to moderate bilateral paranasal sinus inflammation.

## 2021-03-31 IMAGING — DX DG CHEST 1V PORT
1 series · 2 of 2 positions shown · non-contrast
Comparison: None

CLINICAL DATA: Syncopal episode, fell.

EXAM:
PORTABLE CHEST 1 VIEW

[Series 1: chest ap · 0.14mm/px · 2 of 2 slices shown]
[im 1/2]
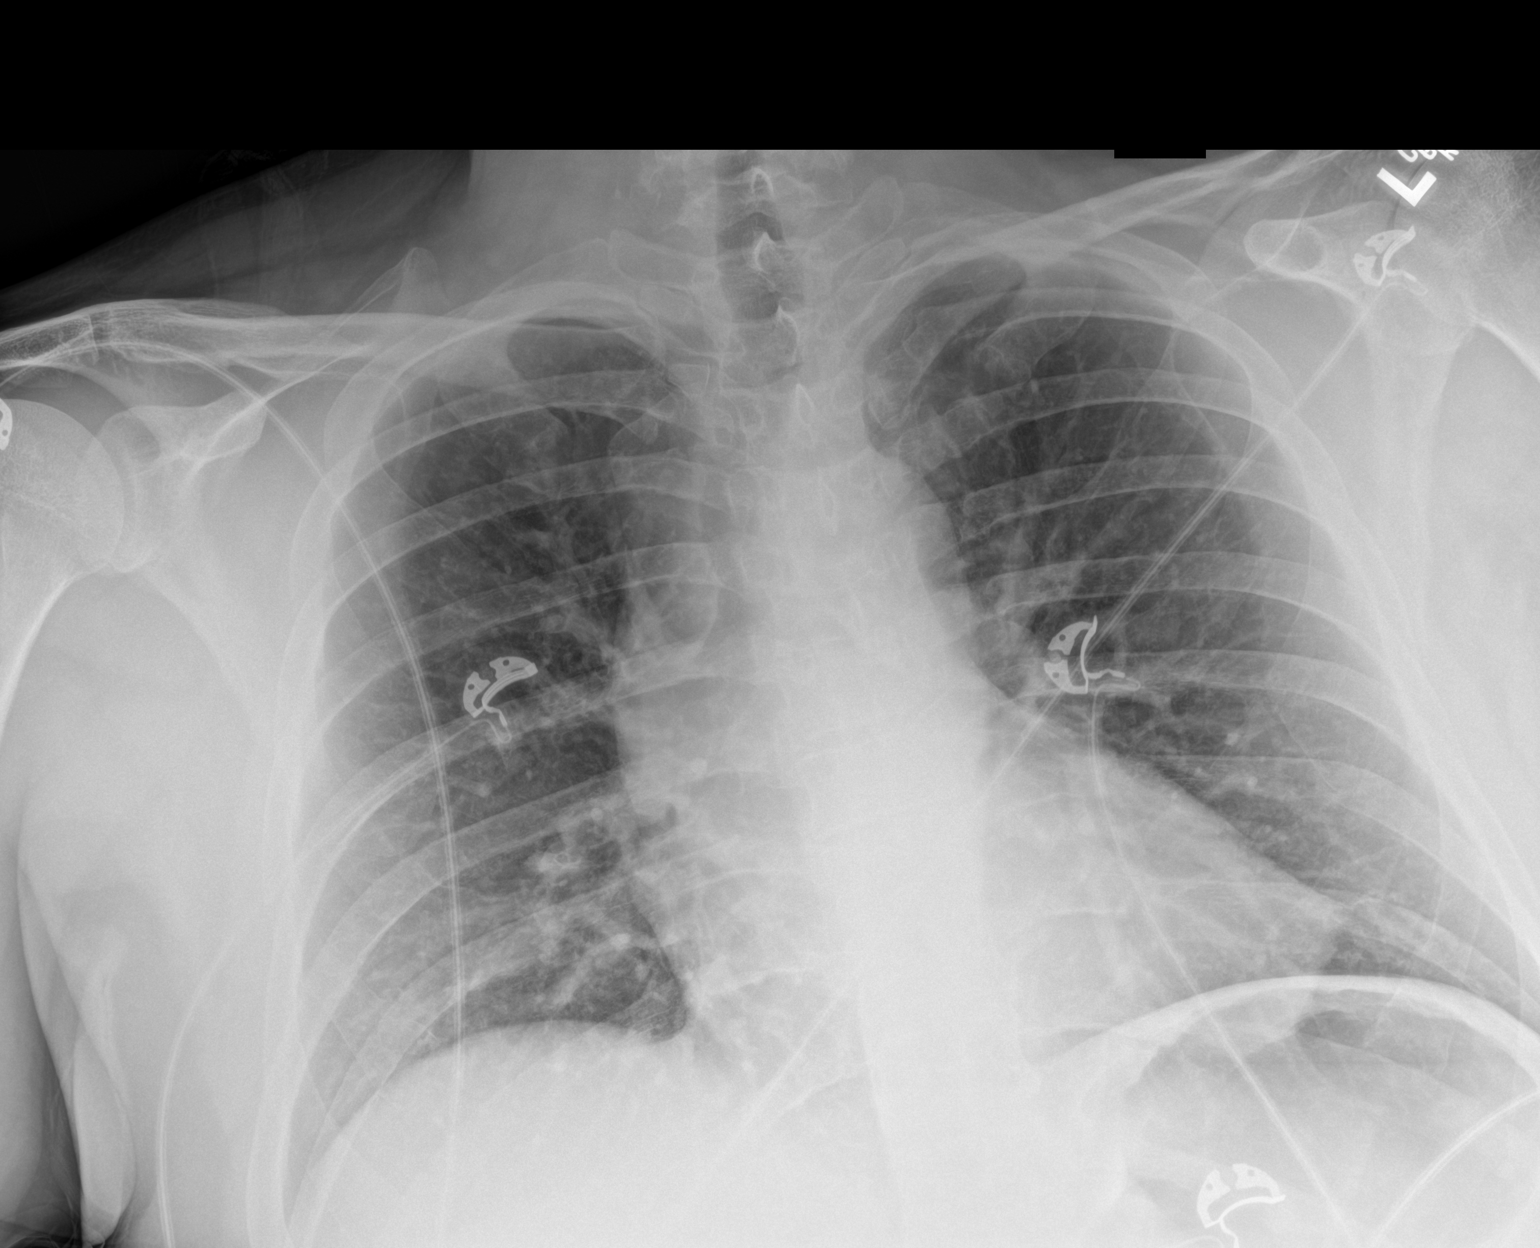
[im 2/2]
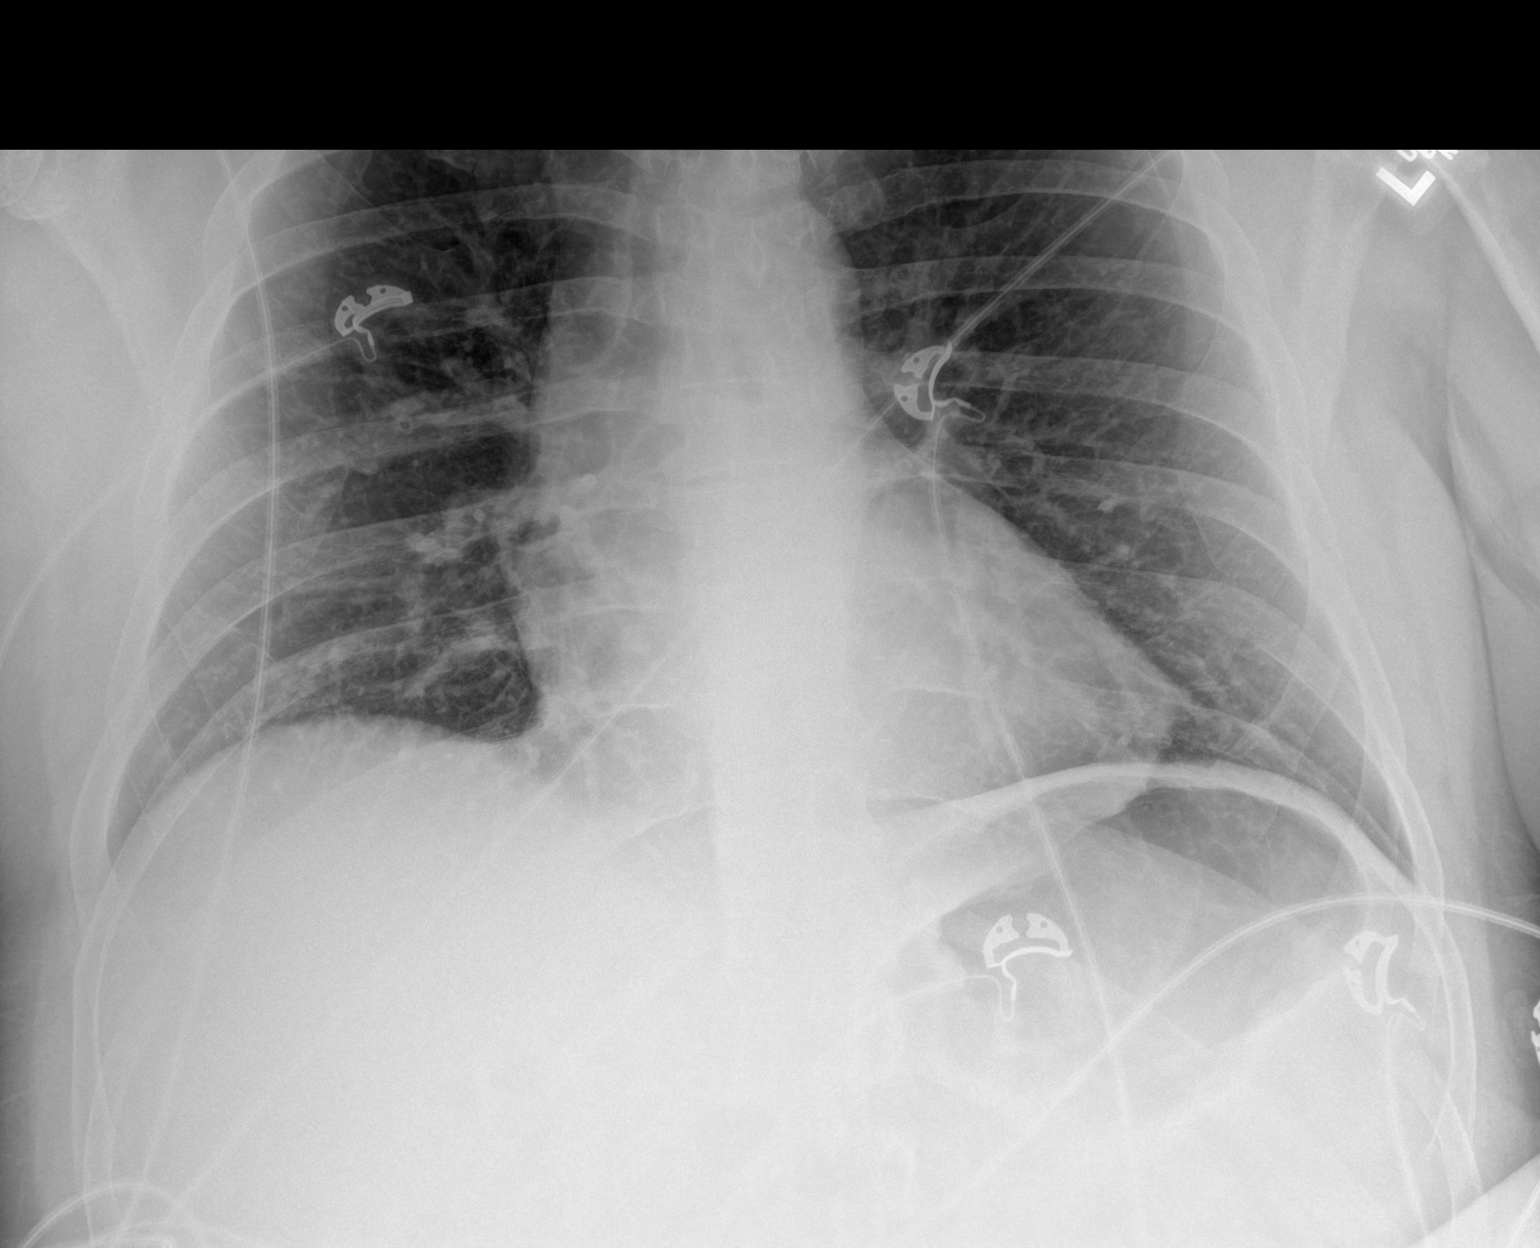

[2 of 2 positions shown; findings below may reference images not displayed]

FINDINGS: The cardiac silhouette, mediastinal and hilar contours are within
normal limits given the AP projection and portable technique. The
lungs are clear of an acute process. No infiltrates, edema or
effusions. No pulmonary lesions. The bony thorax is intact.
IMPRESSION: No acute cardiopulmonary findings.

## 2021-03-31 IMAGING — CT CT HEAD CODE STROKE
3 of 4 series · 13 of 47 positions shown, 15 images · non-contrast
Comparison: Report from [DATE] (without imaging)

CLINICAL DATA: Code stroke.



[Series 3: head wo · axial · 0.43mm/px · z∈[-131,-11]mm · 7 of 33 slices shown, 9 images]
[im 5/33  brain]
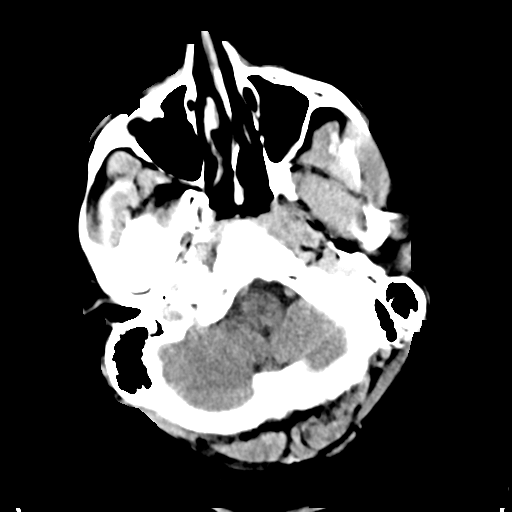
[im 5/33  bone]
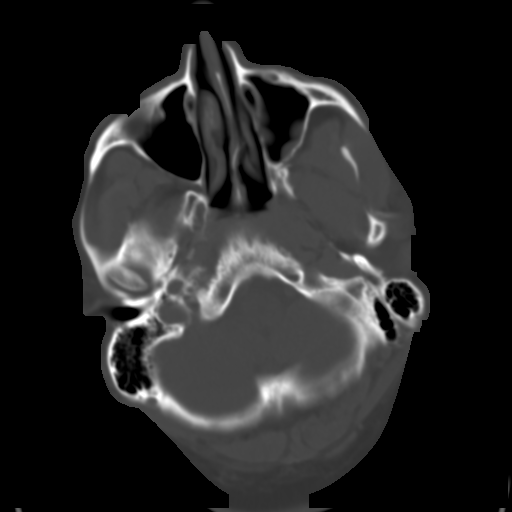
[im 9/33  brain]
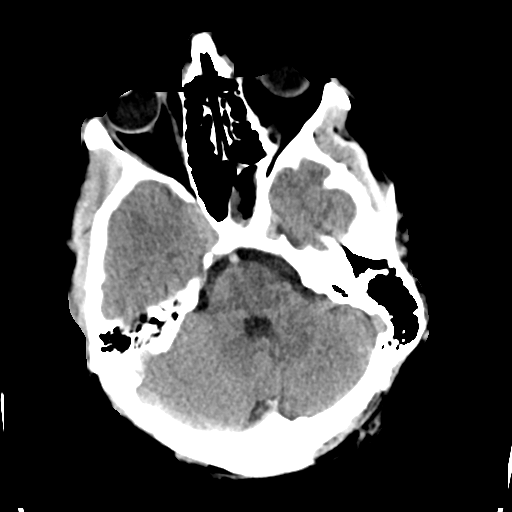
[im 13/33  brain]
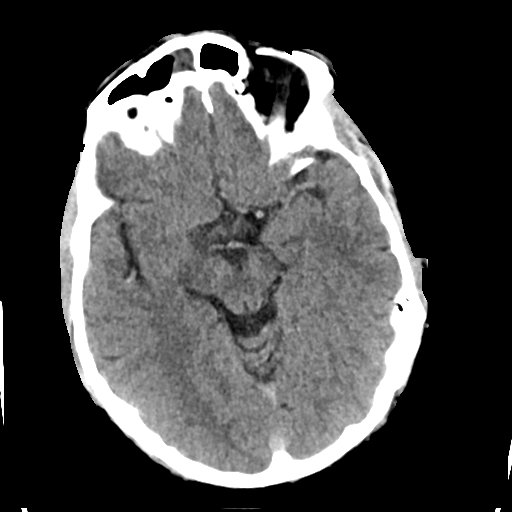
[im 17/33  brain]
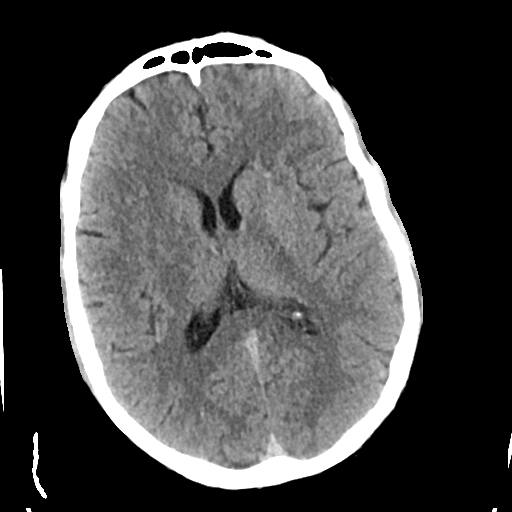
[im 21/33  brain]
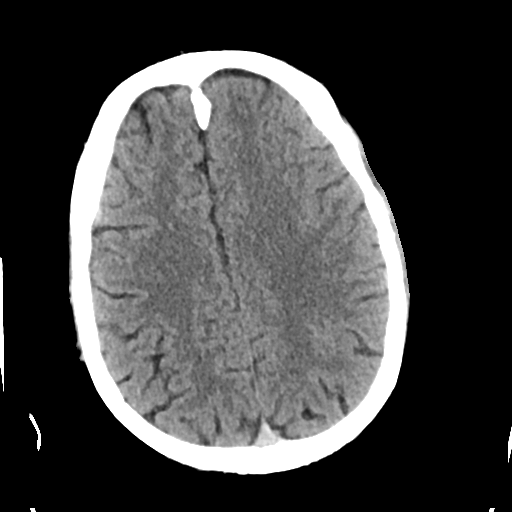
[im 21/33  bone]
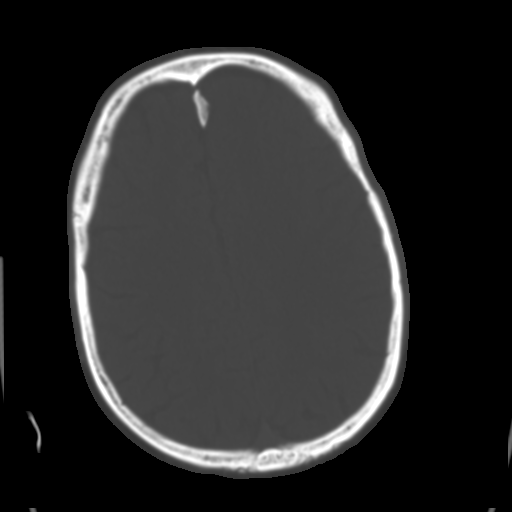
[im 25/33  brain]
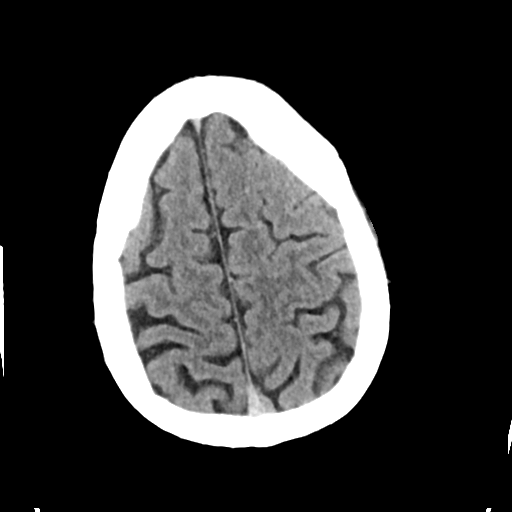
[im 29/33  brain]
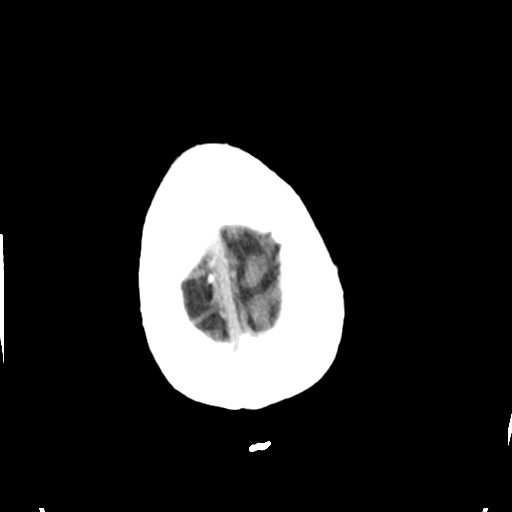

[Series 5: cor soft · coronal · 0.32mm/px · 3 of 101 slices shown]
[im 34/101  brain]
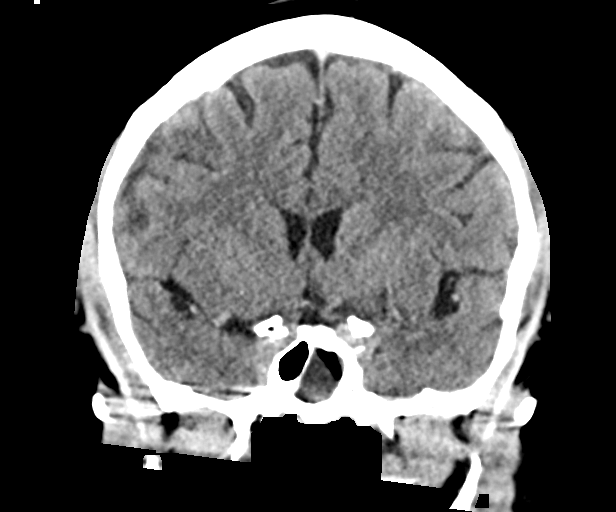
[im 45/101  brain]
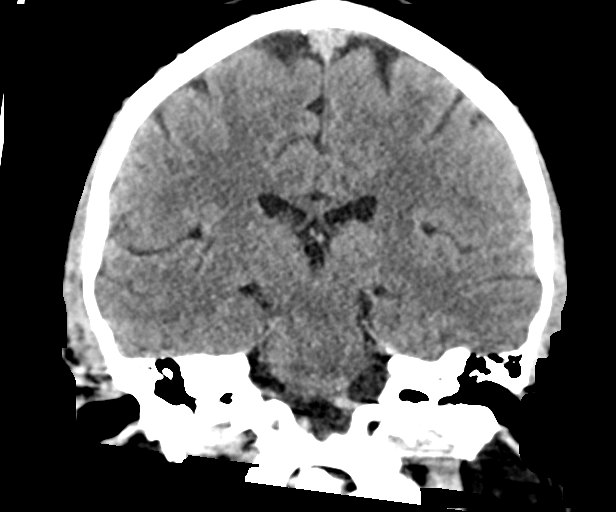
[im 56/101  brain]
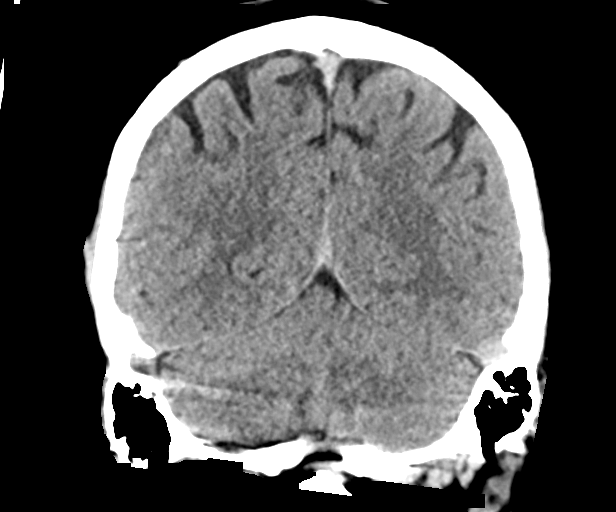

[Series 6: sag soft · sagittal · 0.32mm/px · 3 of 65 slices shown]
[im 24/65  brain]
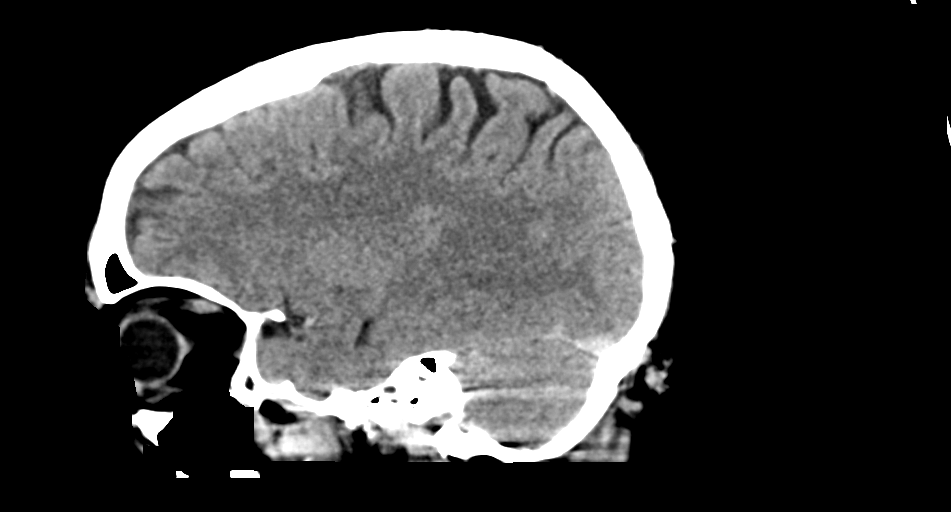
[im 33/65  brain]
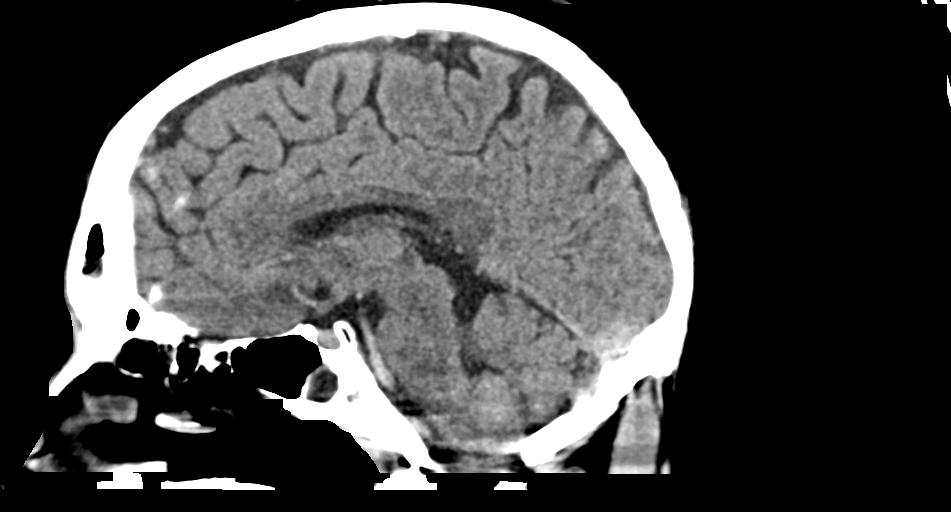
[im 41/65  brain]
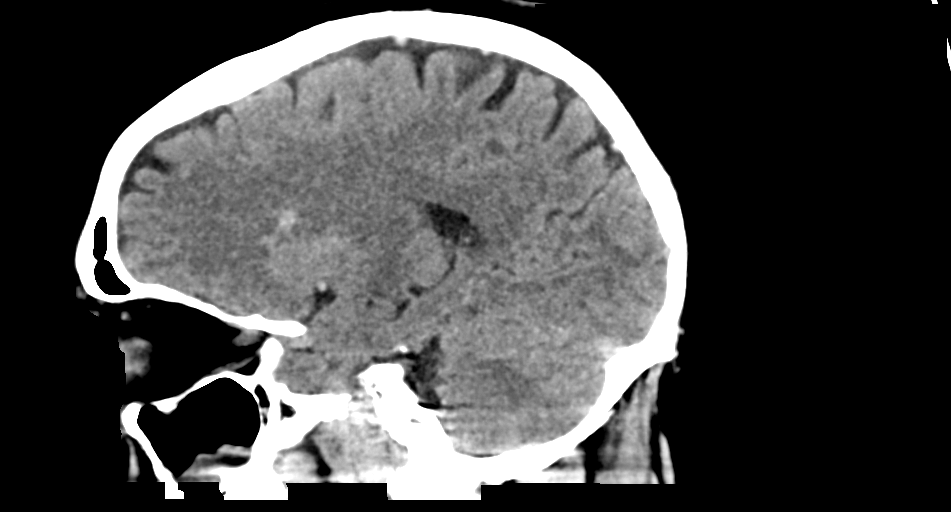

[13 of 47 positions shown; findings below may reference images not displayed]

FINDINGS: Brain: Curvilinear hyperdensity in the left basal ganglia and
overlying corona radiata, measuring 6 x 13 x 11 mm and extending to
the ventricular margin. No evidence of acute large vascular
territory infarct, mass lesion, midline shift or hydrocephalus.

Vascular: Curvilinear hyperdensity is described above. No evidence
of a hyperdense arterial vessel.

Skull: No acute fracture.

Sinuses/Orbits: YASII thickening of the right frontal and anterior
ethmoid air cells and left sphenoid sinus with frothy secretions.

Other: No mastoid effusions.
IMPRESSION: Curvilinear hyperdensity in the left basal ganglia and overlying
corona radiata, described above and probably representing the
developmental venous anomaly described on MRI from [DATE].
Comparison with that prior may be useful if available. Given the
hyperdense appearance, thrombosis of the vein is possible. Acute
intraparenchymal hemorrhage is thought less likely, but not
excluded. Recommend MRI with contrast to further evaluate and to
also assess for dural venous sinus thrombosis.

Findings and recommendations discussed with Dr. YASII via

## 2021-03-31 MED ORDER — OXYCODONE HCL 5 MG PO TABS
5.0000 mg | ORAL_TABLET | Freq: Four times a day (QID) | ORAL | Status: DC | PRN
  Administered 2021-03-31: 5 mg via ORAL
  Filled 2021-03-31 (×2): qty 1

## 2021-03-31 MED ORDER — CLEVIDIPINE BUTYRATE 0.5 MG/ML IV EMUL
INTRAVENOUS | Status: AC
  Administered 2021-03-31: 2 mg/h via INTRAVENOUS
  Filled 2021-03-31: qty 50

## 2021-03-31 MED ORDER — LEVETIRACETAM IN NACL 500 MG/100ML IV SOLN
500.0000 mg | Freq: Two times a day (BID) | INTRAVENOUS | Status: DC
  Administered 2021-03-31: 500 mg via INTRAVENOUS
  Filled 2021-03-31 (×2): qty 100

## 2021-03-31 MED ORDER — CHLORHEXIDINE GLUCONATE CLOTH 2 % EX PADS
6.0000 | MEDICATED_PAD | Freq: Every day | CUTANEOUS | Status: DC
  Administered 2021-04-01 – 2021-04-02 (×2): 6 via TOPICAL

## 2021-03-31 MED ORDER — GADOBUTROL 1 MMOL/ML IV SOLN
9.0000 mL | Freq: Once | INTRAVENOUS | Status: AC | PRN
  Administered 2021-03-31: 9 mL via INTRAVENOUS

## 2021-03-31 MED ORDER — POLYETHYLENE GLYCOL 3350 17 G PO PACK: 17.0000 g | PACK | Freq: Every day | ORAL | Status: DC | PRN

## 2021-03-31 MED ORDER — CHLORHEXIDINE GLUCONATE CLOTH 2 % EX PADS
6.0000 | MEDICATED_PAD | Freq: Every day | CUTANEOUS | Status: DC
  Administered 2021-03-31: 6 via TOPICAL

## 2021-03-31 MED ORDER — LABETALOL HCL 5 MG/ML IV SOLN: 10.0000 mg | Freq: Once | INTRAVENOUS | Status: DC

## 2021-03-31 MED ORDER — FENTANYL CITRATE PF 50 MCG/ML IJ SOSY
50.0000 ug | PREFILLED_SYRINGE | INTRAMUSCULAR | Status: AC
  Administered 2021-03-31: 50 ug via INTRAVENOUS
  Filled 2021-03-31: qty 1

## 2021-03-31 MED ORDER — AMOXICILLIN 500 MG PO CAPS
500.0000 mg | ORAL_CAPSULE | Freq: Three times a day (TID) | ORAL | Status: DC
  Administered 2021-03-31 – 2021-04-03 (×9): 500 mg via ORAL
  Filled 2021-03-31 (×10): qty 1

## 2021-03-31 MED ORDER — DOCUSATE SODIUM 100 MG PO CAPS: 100.0000 mg | ORAL_CAPSULE | Freq: Two times a day (BID) | ORAL | Status: DC | PRN

## 2021-03-31 MED ORDER — CLEVIDIPINE BUTYRATE 0.5 MG/ML IV EMUL
0.0000 mg/h | INTRAVENOUS | Status: DC
  Administered 2021-03-31: 19 mg/h via INTRAVENOUS
  Administered 2021-03-31: 15 mg/h via INTRAVENOUS
  Administered 2021-03-31: 9 mg/h via INTRAVENOUS
  Filled 2021-03-31: qty 100
  Filled 2021-03-31 (×2): qty 50
  Filled 2021-03-31: qty 100
  Filled 2021-03-31: qty 50

## 2021-03-31 MED ORDER — ACETAMINOPHEN 325 MG PO TABS
650.0000 mg | ORAL_TABLET | ORAL | Status: DC | PRN
  Administered 2021-03-31 – 2021-04-01 (×2): 650 mg via ORAL
  Filled 2021-03-31 (×2): qty 2

## 2021-03-31 MED ORDER — LEVETIRACETAM IN NACL 1500 MG/100ML IV SOLN
1500.0000 mg | Freq: Once | INTRAVENOUS | Status: AC
  Administered 2021-03-31: 1500 mg via INTRAVENOUS
  Filled 2021-03-31 (×2): qty 100

## 2021-03-31 MED ORDER — ONDANSETRON HCL 4 MG/2ML IJ SOLN
4.0000 mg | Freq: Four times a day (QID) | INTRAMUSCULAR | Status: DC | PRN
  Administered 2021-03-31 – 2021-04-01 (×2): 4 mg via INTRAVENOUS
  Filled 2021-03-31 (×2): qty 2

## 2021-03-31 MED ORDER — PROCHLORPERAZINE EDISYLATE 10 MG/2ML IJ SOLN
10.0000 mg | Freq: Once | INTRAMUSCULAR | Status: AC
  Administered 2021-03-31: 10 mg via INTRAVENOUS
  Filled 2021-03-31: qty 2

## 2021-03-31 NOTE — Consult Note (Signed)
Neurology Consultation ?Reason for Consult: Seizure ?Referring Physician: Lou Cal ? ?CC: Seizure ? ?History is obtained from: Patient, wife ? ?HPI: Christian Strong. is a 41 y.o. male with no significant past medical history who presents with headache and presumed seizure.  His wife heard him fall this morning and went in to find him confused on the ground.  He had bit his tongue and was quite confused and combative with EMS.  They were concerned due to his combativeness and gave him some Versed.  He was then brought into the emergency department where his mental status rapidly cleared. ? ?Due to headache and presumed seizure, there was concern for ICH and a code stroke was activated.  He was taken for an emergent head CT which demonstrated DVA which was seen previously on a head CT in the past as well.  Initially, the DVA had an appearance of intraparenchymal hemorrhage and therefore he was started on Cleviprex prior to the previous head CT being found. ? ? ?LKW: 3/28 prior to bed ?tpa given?: no, out of window ? ? ?ROS: A 14 point ROS was performed and is negative except as noted in the HPI.  ? ?Past Medical History:  ?Diagnosis Date  ? Depression   ? Thyroid disease   ? ? ? ?Family History  ?Problem Relation Age of Onset  ? Heart attack Mother   ? Heart disease Mother   ? Diabetes Mother   ? Cancer Mother   ? Colon polyps Mother   ? ? ? ?Social History:  reports that he has been smoking cigarettes. He has been smoking an average of .5 packs per day. He has never used smokeless tobacco. He reports current alcohol use of about 3.0 - 4.0 standard drinks per week. He reports current drug use. ? ? ?Exam: ?Current vital signs: ?BP (!) 158/88   Pulse 82   Temp 97.8 ?F (36.6 ?C) (Oral)   Resp 17   SpO2 97%  ?Vital signs in last 24 hours: ?Temp:  [97.8 ?F (36.6 ?C)] 97.8 ?F (36.6 ?C) (03/29 1041) ?Pulse Rate:  [69-98] 82 (03/29 1250) ?Resp:  [11-32] 17 (03/29 1250) ?BP: (132-176)/(85-113) 158/88 (03/29 1250) ?SpO2:   [97 %-100 %] 97 % (03/29 1250) ? ? ?Physical Exam  ?Constitutional: Appears well-developed and well-nourished.  ?Psych: Affect appropriate to situation ?Eyes: No scleral injection ?HENT: No OP obstruction ?MSK: no joint deformities.  ?Cardiovascular: Normal rate and regular rhythm.  ?Respiratory: Effort normal, non-labored breathing ?GI: Soft.  No distension. There is no tenderness.  ?Skin: WDI ? ?Neuro: ?Mental Status: ?Patient is awake, alert, oriented to person, place, month, year, and situation. ?Patient is able to give a clear and coherent history. ?No signs of aphasia or neglect ?Cranial Nerves: ?II: Visual Fields are full. Pupils are equal, round, and reactive to light.   ?III,IV, VI: EOMI without ptosis or diploplia.  ?V: Facial sensation is symmetric to temperature ?VII: Facial movement is symmetric.  ?VIII: hearing is intact to voice ?X: Uvula elevates symmetrically ?XI: Shoulder shrug is symmetric. ?XII: tongue is midline without atrophy or fasciculations.  ?Motor: ?Tone is normal. Bulk is normal. 5/5 strength was present in all four extremities.  ?Sensory: ?Sensation is symmetric to light touch and temperature in the arms and legs. ?Cerebellar: ?FNF intact bilaterally ? ? ? ?I have reviewed labs in epic and the results pertinent to this consultation are: ?Creatinine 1.15 ? ?I have reviewed the images obtained: CT head - hyperdensity in the perventriular  area, initially concerning for hemorrhage.  ?MRI brain - hyperdenisty is a DVA, not hemorrhage.  There are also some areas in the occipital lobe on the FLAIR images concerning for posterior reversible encephalopathy syndrome. ? ?Impression: 41 year old male who presents with suspected new onset seizure in the setting of headache and hypertension.  MRI with findings suspicious for PRES. at this time I would favor strict blood pressure control. ? ?Recommendations: ?1) Keppra 500 mg twice daily ?2) BP control, would guard strictly less than 150 for the next  few hours, then lowered to goal of normotension ?3) EEG ?4) Will follow.  ? ? ?Roland Rack, MD ?[facility] ?[phone] ? ?If 7pm- 7am, please page neurology on call as listed in [place]. ? ?

## 2021-03-31 NOTE — ED Notes (Signed)
CODE STROKE  CALLED TO  DOUG  AT  [facility] 

## 2021-03-31 NOTE — Progress Notes (Signed)
CODE STROKE- PHARMACY COMMUNICATION ? ? ?Time CODE STROKE called/page received:1025 ? ?Time response to CODE STROKE was made: 1037 in person ? ?Time Stroke Kit retrieved from [place] (only if needed): not required ? ?Name of Provider/Nurse contacted:Heather ? ?Past Medical History:  ?Diagnosis Date  ? Depression   ? Thyroid disease   ? ?Prior to Admission medications   ?Medication Sig Start Date End Date Taking? Authorizing Provider  ?ALPRAZolam (XANAX) 0.25 MG tablet Take 1 tablet (0.25 mg total) by mouth 3 (three) times daily as needed for anxiety. 02/19/15   Mar Daring, PA-C  ?citalopram (CELEXA) 20 MG tablet Take 1 tablet (20 mg total) by mouth daily. 02/19/15   Mar Daring, PA-C  ? ? ?Dallie Piles ,PharmD ?Clinical Pharmacist  ?03/31/2021  10:26 AM ? ? ?

## 2021-03-31 NOTE — ED Notes (Signed)
Attempted to given report for a second time to ICU but can't take patient at this time due to charge RN being off the floor. ?

## 2021-03-31 NOTE — ED Notes (Signed)
Informed RN bed assigned 

## 2021-03-31 NOTE — Progress Notes (Signed)
PHARMACY CONSULT NOTE ? ?Pharmacy Consult for Electrolyte Monitoring and Replacement  ? ?Recent Labs: ?Potassium (mmol/L)  ?Date Value  ?03/31/2021 3.7  ? ?Calcium (mg/dL)  ?Date Value  ?03/31/2021 9.2  ? ?Albumin (g/dL)  ?Date Value  ?03/31/2021 4.4  ? ?Sodium (mmol/L)  ?Date Value  ?03/31/2021 137  ? ? ? ?Assessment: 41 y.o. male w/ PMH of HTN and depression presented to the ED with a severe headache. Pharmacy is asked to follow and replace electrolytes while in CCU ? ?Goal of Therapy:  ?Electrolytes WNL ? ?Plan:  ?No electrolyte replacement warranted for now ?Recheck electrolytes with am labs 03/30 ? ?Lowella Bandy ,PharmD ?Clinical Pharmacist ?03/31/2021 1:05 PM ? ?

## 2021-03-31 NOTE — ED Notes (Signed)
Lactic Acid 2.8  Quale, MD aware ?

## 2021-03-31 NOTE — Procedures (Signed)
History: 41 yo M with new onset seizure ? ?Sedation: None ? ?Technique: This EEG was acquired with electrodes placed according to the International 10-20 electrode system (including Fp1, Fp2, F3, F4, C3, C4, P3, P4, O1, O2, T3, T4, T5, T6, A1, A2, Fz, Cz, Pz). The following electrodes were missing or displaced: none. ? ? ?Background: The predominance of this EEG was recorded in drowsiness and sleep. With brief episodes of arousal, there is a posterior dominant rhythm seen that is symmetric with a frequency of 9 Hz, but this is not sustained for long before he falls back asleep. Sleep structures are symmetric.  ? ?Photic stimulation: Physiologic driving is not performed  ? ?EEG Abnormalities: nonee ? ?Clinical Interpretation: This EEG, though somehwat limited due to lack of waking state, is essentially normal. . There was no seizure or seizure predisposition recorded on this study. Please note that lack of epileptiform activity on EEG does not preclude the possibility of epilepsy.  ? ?Ritta Slot, MD ?[facility] ?[phone] ? ?If 7pm- 7am, please page neurology on call as listed in [place]. ? ?

## 2021-03-31 NOTE — ED Provider Notes (Signed)
? ?[facility] ?Provider Note ? ? ? Event Date/Time  ? First MD Initiated Contact with Patient 03/31/21 1021   ?  (approximate) ? ? ?History  ? ?EM caveat: Confusion altered mental status ? ?HPI ? ?Christian Strong. is a 41 y.o. male who according to previous progress note from February 2017 has a history of anxiety hypertension and depression ? ?Patient reports he woke up this morning with severe headache, he tried to get up to take a shower because of the headache, and then not sure what happened.  EMS reports he was noticed to have passed out, EMS was called and when they arrived on scene he was very agitated and highly combative, this improved after receiving 2 mg of Versed.  He did not have obvious seizure-like activity with EMS ? ?Patient reports no history of any medical problems other than high blood pressure ? ?He does not recall the events of what happened well other than getting up with a severe headache ? ?He denies any numbness or tingling but reports an ongoing headache. ? ?No chest pain no shortness of breath ?  ? ? ?Physical Exam  ? ?Triage Vital Signs: ?ED Triage Vitals  ?Enc Vitals Group  ?   BP   ?   Pulse   ?   Resp   ?   Temp   ?   Temp src   ?   SpO2   ?   Weight   ?   Height   ?   Head Circumference   ?   Peak Flow   ?   Pain Score   ?   Pain Loc   ?   Pain Edu?   ?   Excl. in GC?   ? ? ?Most recent vital signs: ?Vitals:  ? 03/31/21 1205 03/31/21 1210  ?BP: (!) 143/86 132/85  ?Pulse: 85 98  ?Resp: 11 16  ?Temp:    ?SpO2: 97% 99%  ? ? ? ?General: Patient is awake, but somewhat drowsy a little bit sedate.  Not a great historian regarding events of today, perhaps some mild underlying confusion ?CV:  Good peripheral perfusion.  Normal heart tones ?Resp:  Normal effort.  Clear lungs. ?Abd:  No distention.  ?Other:   ? ? ?ED Results / Procedures / Treatments  ? ?Labs ?(all labs ordered are listed, but only abnormal results are displayed) ?Labs Reviewed  ?CBC - Abnormal; Notable  for the following components:  ?    Result Value  ? WBC 15.4 (*)   ? RBC 5.88 (*)   ? All other components within normal limits  ?DIFFERENTIAL - Abnormal; Notable for the following components:  ? Neutro Abs 13.0 (*)   ? Abs Immature Granulocytes 0.19 (*)   ? All other components within normal limits  ?COMPREHENSIVE METABOLIC PANEL - Abnormal; Notable for the following components:  ? CO2 15 (*)   ? Glucose, Bld 176 (*)   ? AST 56 (*)   ? ALT 60 (*)   ? Anion gap 19 (*)   ? All other components within normal limits  ?BLOOD GAS, VENOUS - Abnormal; Notable for the following components:  ? pCO2, Ven 40 (*)   ? Acid-base deficit 3.3 (*)   ? All other components within normal limits  ?RESP PANEL BY RT-PCR (FLU A&B, COVID) ARPGX2  ?ETHANOL  ?PROTIME-INR  ?APTT  ?URINE DRUG SCREEN, QUALITATIVE ([facility] ONLY)  ?URINALYSIS, ROUTINE W REFLEX MICROSCOPIC  ?BASIC METABOLIC  PANEL  ?LACTIC ACID, PLASMA  ?SALICYLATE LEVEL  ?CK  ? ? ? ?EKG ? ?Reviewed inter by me at 1040 ?Heart rate 75 ?QRS 100 ?QTc 450 ?Normal sinus rhythm, no evidence of acute ischemia or ectopy ? ? ?RADIOLOGY ? ?Personally reviewed and interpreted the patient's CT scan of the head without contrast, and also discussed directly with Dr. Amada Jupiter.  Concern for some possible density near the left near corona radiata on my review, please see radiologist report for detail ? ?MR ANGIO HEAD WO CONTRAST ? ?Result Date: 03/31/2021 ?CLINICAL DATA:  41 year old male with sudden severe headache. Code stroke presentation. Asymmetric density at the left caudate felt to be DVA on plain head CT this morning. EXAM: MRI HEAD WITHOUT CONTRAST MRA HEAD WITHOUT CONTRAST TECHNIQUE: Multiplanar, multi-echo pulse sequences of the brain and surrounding structures were acquired without intravenous contrast. Angiographic images of the Circle of Willis were acquired using MRA technique without intravenous contrast. COMPARISON:  Head CT 1032 hours today. Report of brain MRI 04/30/2003 (no  images available). FINDINGS: MRI HEAD FINDINGS Brain: Susceptibility artifact on DWI and SWI corresponding to the curvilinear hyperdensity on the earlier CT, and following contrast this is a developmental venous anomaly (DVA, normal variant) with no mass effect or cerebral edema. No superimposed restricted diffusion to suggest acute infarction. No midline shift, mass effect, evidence of mass lesion, ventriculomegaly, extra-axial collection or acute intracranial hemorrhage. Cervicomedullary junction and pituitary are within normal limits. Wallace Cullens and white matter signal is within normal limits throughout the brain. No cortical encephalomalacia or chronic cerebral blood products. No abnormal enhancement identified. No dural thickening. Vascular: Major intracranial vascular flow voids are preserved, see MRA below. Skull and upper cervical spine: Negative visible cervical spine. Visualized bone marrow signal is within normal limits. Sinuses/Orbits: Mild to moderate paranasal sinus mucosal thickening unchanged from the earlier CT and scattered bilaterally. Orbits appear negative. Other: Mastoids are clear.  Negative visible scalp and face. MRA HEAD FINDINGS Anterior circulation: Antegrade flow in both ICA siphons. Right ICA suspects that the right ICA appears slightly dominant along with the right ACA A1. No siphon stenosis. Patent MCA and ACA origins. Anterior communicating artery and visible ACA branches within normal limits, median artery of the corpus callosum (normal variant). The left MCA bifurcates early without stenosis. Right MCA M1 and trifurcation appear patent without stenosis. Visible bilateral MCA branches are within normal limits. Posterior circulation: Antegrade flow in the posterior circulation with codominant distal vertebral arteries. No distal vertebral or basilar stenosis. Patent AICA, SCA and PCA origins. Posterior communicating arteries are diminutive or absent. Bilateral PCA branches are within  normal limits. Anatomic variants: Dominant right ICA and ACA A1. IMPRESSION: 1. Confirmed developmental venous anomaly (normal variant). No acute intracranial abnormality and normal noncontrast MRI appearance of the Brain. 2. Negative intracranial MRA. 3. Mild to moderate bilateral paranasal sinus inflammation. Electronically Signed   By: Odessa Fleming M.D.   On: 03/31/2021 11:45  ? ?MR Brain W and Wo Contrast ? ?Result Date: 03/31/2021 ?CLINICAL DATA:  41 year old male with sudden severe headache. Code stroke presentation. Asymmetric density at the left caudate felt to be DVA on plain head CT this morning. EXAM: MRI HEAD WITHOUT CONTRAST MRA HEAD WITHOUT CONTRAST TECHNIQUE: Multiplanar, multi-echo pulse sequences of the brain and surrounding structures were acquired without intravenous contrast. Angiographic images of the Circle of Willis were acquired using MRA technique without intravenous contrast. COMPARISON:  Head CT 1032 hours today. Report of brain MRI 04/30/2003 (no images available). FINDINGS:  MRI HEAD FINDINGS Brain: Susceptibility artifact on DWI and SWI corresponding to the curvilinear hyperdensity on the earlier CT, and following contrast this is a developmental venous anomaly (DVA, normal variant) with no mass effect or cerebral edema. No superimposed restricted diffusion to suggest acute infarction. No midline shift, mass effect, evidence of mass lesion, ventriculomegaly, extra-axial collection or acute intracranial hemorrhage. Cervicomedullary junction and pituitary are within normal limits. Wallace CullensGray and white matter signal is within normal limits throughout the brain. No cortical encephalomalacia or chronic cerebral blood products. No abnormal enhancement identified. No dural thickening. Vascular: Major intracranial vascular flow voids are preserved, see MRA below. Skull and upper cervical spine: Negative visible cervical spine. Visualized bone marrow signal is within normal limits. Sinuses/Orbits: Mild to  moderate paranasal sinus mucosal thickening unchanged from the earlier CT and scattered bilaterally. Orbits appear negative. Other: Mastoids are clear.  Negative visible scalp and face. MRA HEAD FINDINGS Anterio

## 2021-03-31 NOTE — ED Notes (Signed)
Attempted to give report to ICU- ICU unavailable to take report at this time. ?

## 2021-03-31 NOTE — Progress Notes (Signed)
Zofran 4mg IV given for nausea.

## 2021-03-31 NOTE — Progress Notes (Signed)
Eeg done 

## 2021-03-31 NOTE — Progress Notes (Signed)
?  Chaplain On-Call responded to Code Stroke notification to ED-[address]. ? ?Patient returned from CT Scan procedures and was moved to ED-4. ? ?Patient was being attended by the medical team. ? ?Chaplain is available if family arrives and support is requested. ? ?Chaplain Evelena Peat ?M.Div., [facility] ?

## 2021-03-31 NOTE — H&P (Addendum)
? ?NAME:  Maya Scholer., MRN:  [id number], DOB:  [birth year], LOS: 0 ?ADMISSION DATE:  03/31/2021, CONSULTATION DATE: 03/31/2021 ?REFERRING MD: Dr. Fanny Bien, CHIEF COMPLAINT: Altered Mental Status  ? ?History of Present Illness:  ?This is a 41 yo male who presented to [facility] ER on 03/29 after being found by his wife on the floor unresponsive with vomit around his mouth, therefore EMS notified.  Upon EMS arrival the pt was very agitated and highly combative, therefore he received 2 mg of iv ativan.  EMS reported the pt did not have any signs of seizure activity when they arrived.  The pt reports around 3 am the morning of 03/29 he had a 10/10 headache.  His wife reports the pt complained of a headache on 03/26-03/27.  He is currently taking amoxicillin 875 mg bid prescribed by his outpatient provider for a sinus infection.  ? ?ED Course ?In the ER code stroke initiated and neurologist Dr. Amada Jupiter consulted stat.  ER vital signs revealed pt severely hypertensive bp 173/113, cleviprex gtt initiated to maintain sbp 150 or less.  Pt awake but drowsy on arrival with some confusion per ER provider notes.  CT Head revealed curvilinear hyperdensity in the left basal ganglia and overlying the corona radiata which was previously seen on MRI in April 2005.  MRI Brain concerning for mild posterior reversible encephalopathy syndrome in the occipital poles.  Lab results revealed alcohol level <10, salicylate level <7.0, CK 890, lactic acid 2.8, and wbc 15.4.  In the ER pt received 1,500 mg iv keppra x1 dose, 50 mcg of iv fentanyl, and 10 mg of iv compazine.  PCCM team consulted for ICU admission. ? ?Pertinent  Medical History  ?Depression ?Hyperthyroidism  ?Hypertension  ? 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 Comparison with that prior may be useful if available. Given the hyperdense appearance, thrombosis of the vein is possible. Acute intraparenchymal hemorrhage is thought less likely, but not excluded. Recommend MRI with contrast to further evaluate and to also assess for dural venous sinus thrombosis. ?03/29: MR Brain/MRA Head suspicious for mild posterior reversible encephalopathy syndrome (PRES) in the occipital poles. Mild to moderate bilateral paranasal sinus inflammation. ? ?Interim History / Subjective:  ?Pt resting in bed bp within goal range on Cleviprex gtt.  He states his headache has improved currently 3/10.   ? ?Objective   ?Blood pressure 132/85, pulse 98, temperature 97.8 ?F (36.6 ?C), temperature source Oral, resp. rate 16, SpO2 99 %. ?   ?   ?No intake or output data in the 24 hours ending 03/31/21 1239 ?There were no vitals filed for this visit. ? ?Examination: ?General: Acutely ill appearing male, NAD on RA  ?HENT: Supple, no JVD  ?Lungs: Clear throughout, even, non labored  ?Cardiovascular: NSR, rrr, no R/G, 2+ radial/2+ distal pulses, no edema ?Abdomen: +BS x4, soft, obese, non tender, non distended ?Extremities: Normal bulk and tone, moves all extremities  ?Neuro: Alert and oriented, follows commands, 5/5 BUE and BLE motor strength, PERRLA ?GU: deferred ? ?Resolved Hospital Problem list   ? ? ?Assessment & Plan:  ?Hypertensive Emergency ?Posterior reversible encephalopathy syndrome (PRES) ?New onset seizure activity  ?-  Continuous telemetry monitoring  ?- Cleviprex gtt to maintain sbp 150 or less ?- Lipid panel and thyroid panel with TSH pending  ?- Urine drug screen pending  ?- Trend troponin's  ?- Neurology consulted appreciate input~keppra 500 mg q12hrs and EEG pending ?- Seizure precautions  ?- Neuro checks per  modified stroke scale  ? ?Mild rhabdomyolysis ?- Trend CK   ? ?Leukocytosis secondary to bilateral paranasal sinus inflammation ?Lactic acidosis ?- Trend WBC and monitor fever curve  ?- Trend lactic acid  ?- Will resume outpatient amoxicillin  ? ?Best Practice (right click and "Reselect all SmartList Selections" daily)  ? ?Diet/type: Regular consistency (see orders) ?DVT prophylaxis: SCD ?GI prophylaxis: N/A ?Lines: N/A ?Foley:  N/A ?Code Status:  full code ?Last date of multidisciplinary goals of care discussion [N/A] ? ?Updated pt and pts wife at bedside regarding current plan of care all questions were answered.  ?Labs   ?CBC: ?Recent Labs  ?Lab 03/31/21 ?1026  ?WBC 15.4*  ?NEUTROABS 13.0*  ?HGB 16.0  ?HCT 48.0  ?MCV 81.6  ?PLT 311  ? ? ?Basic Metabolic Panel: ?Recent Labs  ?Lab 03/31/21 ?1026  ?NA 139  ?K 3.9  ?CL 105  ?CO2 15*  ?GLUCOSE 176*  ?BUN 15  ?CREATININE 1.15  ?CALCIUM 9.1  ? ?GFR: ?CrCl cannot be calculated (Unknown ideal weight.). ?Recent Labs  ?Lab 03/31/21 ?1026 03/31/21 ?1143  ?WBC 15.4*  --   ?LATICACIDVEN  --  2.8*  ? ? ?Liver Function Tests: ?Recent Labs  ?Lab 03/31/21 ?1026  ?AST 56*  ?ALT 60*  ?ALKPHOS 92  ?BILITOT 0.7  ?PROT 8.1  ?ALBUMIN 4.4  ? ?No results for input(s): LIPASE, AMYLASE in the last 168 hours. ?No results for input(s): AMMONIA in the last 168 hours. ? ?ABG ?   ?Component Value Date/Time  ? HCO3 22.1 03/31/2021 1140  ? ACIDBASEDEF 3.3 (H) 03/31/2021 1140  ? O2SAT 72.9 03/31/2021 1140  ?  ? ?Coagulation Profile: ?Recent Labs  ?Lab 03/31/21 ?1026  ?INR 1.1  ? ? ?Cardiac Enzymes: ?No results for input(s): CKTOTAL, CKMB, CKMBINDEX, TROPONINI in the last 168 hours. ? ?HbA1C: ?No results found for: HGBA1C ? ?CBG: ?No results for input(s): GLUCAP in the last 168 hours. ? ?Review of Systems: Positives in BOLD   ?Gen: Denies fever, chills, weight change, fatigue, night sweats ?HEENT: Denies blurred vision, double vision, hearing loss, tinnitus, sinus congestion, rhinorrhea, sore  throat, neck stiffness, dysphagia ?PULM: Denies shortness of breath, cough, sputum production, hemoptysis, wheezing ?CV: Denies chest pain, edema, orthopnea, paroxysmal nocturnal dyspnea, palpitations ?GI: abdominal pain, nausea, vomiting, diarrhea, hematochezia, melena, constipation, change in bowel habits ?GU: Denies dysuria, hematuria, polyuria, oliguria, urethral discharge ?Endocrine: Denies hot or cold intolerance, polyuria, polyphagia or appetite change ?Derm: Denies rash, dry skin, scaling or peeling skin change ?Heme: Denies easy bruising, bleeding, bleeding gums ?Neuro: headache, fall, numbness, weakness, slurred speech, loss of memory or consciousness ? ? ?Past Medical History:  ?He,  has a past medical history of Depression and Thyroid disease.  ? ?Surgical History:  ? ?Past Surgical History:  ?Procedure Laterality Date  ? TUMOR REMOVAL  1992  ? thyroid  ?  ? ?Social History:  ? reports that he has been smoking cigarettes. He has been smoking an average of .5 packs per day. He has never used smokeless tobacco. He reports current alcohol use of about 3.0 - 4.0 standard drinks per week. He reports current drug use.  ? ?Family History:  ?His family history includes Cancer in  his mother; Colon polyps in his mother; Diabetes in his mother; Heart attack in his mother; Heart disease in his mother.  ? ?Allergies ?No Known Allergies  ? ?Home Medications  ?Prior to Admission medications   ?Medication Sig Start Date End Date Taking? Authorizing Provider  ?ALPRAZolam (XANAX) 0.25 MG tablet Take 1 tablet (0.25 mg total) by mouth 3 (three) times daily as needed for anxiety. 02/19/15   Margaretann LovelessBurnette, Jennifer M, PA-C  ?citalopram (CELEXA) 20 MG tablet Take 1 tablet (20 mg total) by mouth daily. 02/19/15   Margaretann LovelessBurnette, Jennifer M, PA-C  ?  ? ?Critical care time: 60 minutes  ?  ? ? ?Zada Girtana Nelson, AGNP  ?Pulmonary/Critical Care ?Pager [phone] (please enter [phone] digits) ?PCCM Consult Pager [phone] (please enter 7 digits)  ? ? ?

## 2021-03-31 NOTE — ED Notes (Signed)
Informed RN bed assigned again 

## 2021-03-31 NOTE — ED Triage Notes (Signed)
Pt comes into the ED via EMS from home,wife reports pt got up at 3am with HA, then when he was getting up around 9am pt stood and then passed out, upon EMS arrival pt was diaphoretic, combative, biting, then suddenly was alert asking what happened.  ? ?2mg  versed IM ?#18gLAC ?CBG131 ?98.7 ?178/113 ?HR70's ?

## 2021-03-31 NOTE — Code Documentation (Signed)
Stroke Response Nurse Documentation ?Code Documentation ? ?Christian Strong. is a 41 y.o. male arriving to [facility] ED via [facility] on 3/29 with past medical hx of HTN. On No antithrombotic. Code stroke was activated by EDP.  ? ?Patient from home where he was LKW at 0300 this am. Pt got up at 0300 with severe headache, pt went back to sleep and woke at 0900 and had a syncopal episode along with bad headache. 911 was called, when EMS arrived pt was combative and confused. Pt was given 2mg  IM versed to calm. Pt arrived to ED A/O x 3. By arriving to CT NIH of 0. ? ?Stroke team at the bedside on patient arrival. Labs drawn and patient cleared for CT by Dr. . Patient to CT with team. NIHSS 1, see documentation for details and code stroke times. Patient with disoriented on exam. The following imaging was completed:  CT head. Patient is not a candidate for IV Thrombolytic due to ICH seen on CT.  ?Care/Plan: Pt to have BP control by clevipex per Dr Fanny Bien and go to MRI then possible tx to [facility].  ? ?Bedside handoff with ED RN [facility].   ? ?Lillia Abed ?Stroke Coordinator RN ?  ?

## 2021-03-31 NOTE — Plan of Care (Signed)

## 2021-03-31 NOTE — ED Notes (Signed)
Patient to MRI on monitor with this RN and stroke Airline pilot. ?

## 2021-03-31 NOTE — ED Notes (Signed)
Patient back to ED from MRI.

## 2021-04-01 ENCOUNTER — Inpatient Hospital Stay
Admit: 2021-04-01 | Discharge: 2021-04-01 | Disposition: A | Discharge status: Home / Self Care | Payer: Commercial Managed Care - PPO | Attending: Critical Care Medicine | Admitting: Critical Care Medicine

## 2021-04-01 ENCOUNTER — Telehealth: Payer: Self-pay | Admitting: Physician Assistant

## 2021-04-01 LAB — CBC
HCT: 43.6 % (ref 39.0–52.0)
Hemoglobin: 14.4 g/dL (ref 13.0–17.0)
MCH: 26.8 pg (ref 26.0–34.0)
MCHC: 33 g/dL (ref 30.0–36.0)
MCV: 81 fL (ref 80.0–100.0)
Platelets: 247 10*3/uL (ref 150–400)
RBC: 5.38 MIL/uL (ref 4.22–5.81)
RDW: 13 % (ref 11.5–15.5)
WBC: 10.9 10*3/uL — ABNORMAL HIGH (ref 4.0–10.5)
nRBC: 0 % (ref 0.0–0.2)

## 2021-04-01 LAB — ECHOCARDIOGRAM COMPLETE
AR max vel: 3.06 cm2
AV Area VTI: 3.74 cm2
AV Area mean vel: 3.28 cm2
AV Mean grad: 7 mmHg
AV Peak grad: 13.4 mmHg
Ao pk vel: 1.83 m/s
Area-P 1/2: 3.21 cm2
Height: 72 in
MV VTI: 4.17 cm2
S' Lateral: 2.8 cm
Weight: 3615.54 oz

## 2021-04-01 LAB — THYROID PANEL WITH TSH
Free Thyroxine Index: 2.1 (ref 1.2–4.9)
T3 Uptake Ratio: 26 % (ref 24–39)
T4, Total: 8.1 ug/dL (ref 4.5–12.0)
TSH: <0.005 u[IU]/mL — ABNORMAL LOW (ref 0.450–4.500)

## 2021-04-01 LAB — BASIC METABOLIC PANEL
Anion gap: 10 (ref 5–15)
BUN: 19 mg/dL (ref 6–20)
CO2: 24 mmol/L (ref 22–32)
Calcium: 8.5 mg/dL — ABNORMAL LOW (ref 8.9–10.3)
Chloride: 102 mmol/L (ref 98–111)
Creatinine, Ser: 1.7 mg/dL — ABNORMAL HIGH (ref 0.61–1.24)
GFR, Estimated: 51 mL/min — ABNORMAL LOW (ref >60)
Glucose, Bld: 108 mg/dL — ABNORMAL HIGH (ref 70–99)
Potassium: 3.4 mmol/L — ABNORMAL LOW (ref 3.5–5.1)
Sodium: 136 mmol/L (ref 135–145)

## 2021-04-01 LAB — MAGNESIUM: Magnesium: 2.5 mg/dL — ABNORMAL HIGH (ref 1.7–2.4)

## 2021-04-01 LAB — CK: Total CK: 2773 U/L — ABNORMAL HIGH (ref 49–397)

## 2021-04-01 LAB — PHOSPHORUS: Phosphorus: 4.2 mg/dL (ref 2.5–4.6)

## 2021-04-01 MED ORDER — AMLODIPINE BESYLATE 5 MG PO TABS
5.0000 mg | ORAL_TABLET | Freq: Every day | ORAL | Status: DC
  Administered 2021-04-01 – 2021-04-03 (×3): 5 mg via ORAL
  Filled 2021-04-01 (×3): qty 1

## 2021-04-01 MED ORDER — ENOXAPARIN SODIUM 60 MG/0.6ML IJ SOSY
50.0000 mg | PREFILLED_SYRINGE | INTRAMUSCULAR | Status: DC
  Administered 2021-04-01 – 2021-04-02 (×2): 50 mg via SUBCUTANEOUS
  Filled 2021-04-01 (×2): qty 0.5
  Filled 2021-04-01: qty 0.6

## 2021-04-01 MED ORDER — HYDROXYZINE HCL 25 MG PO TABS
25.0000 mg | ORAL_TABLET | Freq: Three times a day (TID) | ORAL | Status: DC | PRN
  Administered 2021-04-01 – 2021-04-02 (×2): 25 mg via ORAL
  Filled 2021-04-01 (×2): qty 1

## 2021-04-01 MED ORDER — POTASSIUM CHLORIDE CRYS ER 20 MEQ PO TBCR
40.0000 meq | EXTENDED_RELEASE_TABLET | Freq: Once | ORAL | Status: AC
  Administered 2021-04-01: 40 meq via ORAL
  Filled 2021-04-01: qty 2

## 2021-04-01 MED ORDER — LEVETIRACETAM 500 MG PO TABS
500.0000 mg | ORAL_TABLET | Freq: Two times a day (BID) | ORAL | Status: DC
  Administered 2021-04-01 – 2021-04-03 (×5): 500 mg via ORAL
  Filled 2021-04-01 (×6): qty 1

## 2021-04-01 NOTE — Plan of Care (Signed)
?  Problem: Clinical Measurements: ?Goal: Respiratory complications will improve ?Outcome: Adequate for Discharge ?  ?Problem: Nutrition: ?Goal: Adequate nutrition will be maintained ?Outcome: Progressing ?  ?

## 2021-04-01 NOTE — Progress Notes (Signed)
Shift Summary: ?Patient A/O x 4 this shift, lethargic. Cleviprex off since 2200, BP remained between 130-150 goal. C/O back pain and generalized aching, treated with PRN  Roxicodone x 1. STE in lead V only, same as admission EKG. No acute events this shift. Wife remains at bedside.  ?

## 2021-04-01 NOTE — Progress Notes (Signed)
*  PRELIMINARY RESULTS* ?Echocardiogram ?2D Echocardiogram has been performed. ? ?Christian Strong ?04/01/2021, 9:50 AM ?

## 2021-04-01 NOTE — Progress Notes (Signed)
Subjective: ?Patient reports improved symptoms ? ?Exam: ?Vitals:  ? 04/01/21 1000 04/01/21 1552  ?BP: (!) 146/94 (!) 147/98  ?Pulse: 67 74  ?Resp: 19 (!) 24  ?Temp: 97.7 ?F (36.5 ?C) 98.4 ?F (36.9 ?C)  ?SpO2: 99% 100%  ? ?Gen: In bed, NAD ?Resp: non-labored breathing, no acute distress ?Abd: soft, nt ? ?Neuro: ?MS: awake, alert, oriented ?[id number], PERRL, VFF ?Motor: 5/5 throughout ?Sensory:intact to LT ? ?Pertinent Labs: ?MG 2.5 ?Cr 1.7 ? ?Impression: 41 yo M with hypertensive emergency and findings on MRI concerning for PRES. [place] his LOC was unwitnessed, with tongue biting, post-ictal confusion and combativeness, I strongly suspect seizure. 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 I would favor continuing AEDs in the short term.   ? ?Recommendations: ?1) Keppra 500mg  BID x 1 month ?2) consider repeat MRI as outpatient ?3) BP control ?4) neurology will be available as needed.  ?5) I discussed that he is not allowed to drive per [place] law until released by outpatient neurologist or 6 months from most recent seizure.  ? ?Roland Rack, MD ?[facility] ?[phone] ? ?If 7pm- 7am, please page neurology on call as listed in [place]. ? ?

## 2021-04-01 NOTE — Telephone Encounter (Signed)
Copied from CRM [id number]. Topic: Appointment Scheduling - Scheduling Inquiry for Clinic ?>> Apr 01, 2021  8:28 AM Fanny Bien wrote: ?Reason for CRM: Pt wife called and scheduled a New patient appointment. Patient is currently in the hospital and would like to know if we can work patient in sooner. Please advise ?

## 2021-04-01 NOTE — Progress Notes (Signed)
PHARMACY CONSULT NOTE ? ?Pharmacy Consult for Electrolyte Monitoring and Replacement  ? ?Recent Labs: ?Potassium (mmol/L)  ?Date Value  ?03/31/2021 3.7  ? ?Magnesium (mg/dL)  ?Date Value  ?03/31/2021 2.5 (H)  ? ?Calcium (mg/dL)  ?Date Value  ?03/31/2021 9.2  ? ?Albumin (g/dL)  ?Date Value  ?03/31/2021 4.4  ? ?Phosphorus (mg/dL)  ?Date Value  ?03/31/2021 3.6  ? ?Sodium (mmol/L)  ?Date Value  ?03/31/2021 137  ? ? ?Assessment: 41 y.o. male w/ PMH of HTN and depression presented to the ED on 3/29 with a severe headache. Found to have DVA on CT, admitted with concern for PRES upon findings of MRI. PMH includes hyperthyroidism, hypertension, depression. Pharmacy is asked to follow and replace electrolytes while in CCU. ? ?Infusions ?Clevidipine 16 > 6 > 0 mg/hr continuous ?Levetiracetam 500 mg Q12 hours ? ?Scr 1.04 > 1.70 ? ?Goal of Therapy:  ?Electrolytes WNL ? ?Plan:  ?K 3.4. Give Kcl 40 mEq x 1. ?Recheck electrolytes with am labs 03/31 ? ? ?Wynelle Cleveland, PharmD ?Pharmacy Resident  ?04/01/2021 ?8:05 AM ? ? ? ?

## 2021-04-01 NOTE — Progress Notes (Addendum)
? ?NAME:  Christian Strong., MRN:  [id number], DOB:  Oct 11, 1980, LOS: 1 ?ADMISSION DATE:  03/31/2021, CONSULTATION DATE: 03/31/2021 ?REFERRING MD: Dr. Fanny Bien, CHIEF COMPLAINT: Altered Mental Status  ? ?History of Present Illness:  ?This is a 41 yo male who presented to [facility] ER on 03/29 after being found by his wife on the floor unresponsive with vomit around his mouth, therefore EMS notified.  Upon EMS arrival the pt was very agitated and highly combative, therefore he received 2 mg of iv ativan.  EMS reported the pt did not have any signs of seizure activity when they arrived.  The pt reports around 3 am the morning of 03/29 he had a 10/10 headache.  His wife reports the pt complained of a headache on 03/26-03/27.  He is currently taking amoxicillin 875 mg bid prescribed by his outpatient provider for a sinus infection.  ? ?ED Course ?In the ER code stroke initiated and neurologist Dr. Amada Jupiter consulted stat.  ER vital signs revealed pt severely hypertensive bp 173/113, cleviprex gtt initiated to maintain sbp 150 or less.  Pt awake but drowsy on arrival with some confusion per ER provider notes.  CT Head revealed curvilinear hyperdensity in the left basal ganglia and overlying the corona radiata which was previously seen on MRI in April 2005.  MRI Brain concerning for mild posterior reversible encephalopathy syndrome in the occipital poles.  Lab results revealed alcohol level <10, salicylate level <7.0, CK 890, lactic acid 2.8, and wbc 15.4.  In the ER pt received 1,500 mg iv keppra x1 dose, 50 mcg of iv fentanyl, and 10 mg of iv compazine.  PCCM team consulted for ICU admission. ? ?Pertinent  Medical History  ?Depression ?Hyperthyroidism  ?Hypertension  ? 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 Comparison with that prior may be useful if available. Given the hyperdense appearance, thrombosis of the vein is possible. Acute intraparenchymal hemorrhage is thought less likely, but not excluded. Recommend MRI with contrast to further evaluate and to also assess for dural venous sinus thrombosis. ?03/29: MR Brain/MRA Head suspicious for mild posterior reversible encephalopathy syndrome (PRES) in the occipital poles. Mild to moderate bilateral paranasal sinus inflammation. ? ?Interim History / Subjective:  ? ?Off Cleviprex drip.  Complains of nausea. ?Wants breakfast ? ?Objective   ?Blood pressure (!) 121/109, pulse (!) 59, temperature 98.7 ?F (37.1 ?C), temperature source Oral, resp. rate 19, height 6' (1.829 m), weight 102.5 kg, SpO2 98 %. ?   ?   ? ?Intake/Output Summary (Last 24 hours) at 04/01/2021 0813 ?Last data filed at 04/01/2021 0000 ?Gross per 24 hour  ?Intake 561.28 ml  ?Output 1400 ml  ?Net -838.72 ml  ? ?Filed Weights  ? 04/01/21 0341  ?Weight: 102.5 kg  ? ? ?Examination: ?Gen:      No acute distress ?HEENT:  EOMI, sclera anicteric ?Neck:     No masses; no thyromegaly ?Lungs:    Clear to auscultation bilaterally; normal respiratory effort ?CV:         Regular rate and rhythm; no murmurs ?Abd:      + bowel sounds; soft, non-tender; no palpable masses, no distension ?Ext:  No edema; adequate peripheral perfusion ?Skin:      Warm and dry; no rash ?Neuro: alert and oriented x 3 ?Psych: normal mood and affect  ? ?Labs/imaging reviewed ?Significant for potassium 3.4, creatinine 1.70 ?WBC 10.9, platelets 249 ?No new imaging ? ?Resolved Hospital Problem list   ? ? ?Assessment & Plan:  ?Hypertensive Emergency ?Posterior reversible encephalopathy syndrome (PRES) ?New onset seizure activity  ?Continue telemetry monitoring ?Off  Cleviprex drip ?UDS positive for cannabinoid, benzo ?Echocardiogram pending ?Start Norvasc ?Change Keppra to p.o. ?Follow final EEG report ?TSH pending ? ?Mild rhabdomyolysis ?AKI ?Follow CK ?Monitor urine output and creatinine ? ?Leukocytosis secondary to bilateral paranasal sinus inflammation ?Lactic acidosis > improved ?Continue outpatient amoxicillin  ? ?Nausea ?Zofran as needed. ? ?Suspected sleep apnea ?Has loud snoring and witnessed apneas at night.  Advised him to get sleep study as an outpatient ? ?Stable for transfer out of ICU and to hospitalist service. ? ?Best Practice (right click and "Reselect all SmartList Selections" daily)  ? ?Diet/type: Regular consistency (see orders) ?DVT prophylaxis: SCD ?GI prophylaxis: N/A ?Lines: N/A ?Foley:  N/A ?Code Status:  full code ?Last date of multidisciplinary goals of care discussion [N/A] ? ?Critical care time: NA  ? ?Chilton Greathouse MD ?[facility] ?See Amion for pager ? ?If no response to pager , please call [phone] until 7pm ?After 7:00 pm call Elink  [phone] ?04/01/2021, 8:37 AM  ? ? ?

## 2021-04-01 NOTE — TOC Initial Note (Signed)
Transition of Care (TOC) - Initial/Assessment Note  ? ? ?Patient Details  ?Name: Christian Strong. ?MRN: [id number] ?Date of Birth: [birth year] ? ?Transition of Care (TOC) CM/SW Contact:    ?Allayne Butcher, RN ?Phone Number: ?04/01/2021, 11:33 AM ? ?Clinical Narrative:                 ? ?Transition of Care (TOC) Screening Note ? ? ?Patient Details  ?Name: Christian Strong. ?Date of Birth: [birth year] ? ? ?Transition of Care (TOC) CM/SW Contact:    ?Allayne Butcher, RN ?Phone Number: ?04/01/2021, 11:33 AM ? ? ? ?Transition of Care Department [facility]) has reviewed patient and no TOC needs have been identified at this time. We will continue to monitor patient advancement through interdisciplinary progression rounds. If new patient transition needs arise, please place a TOC consult. ?  ? ?Expected Discharge Plan: Home/Self Care ?Barriers to Discharge: Continued Medical Work up ? ? ?Patient Goals and CMS Choice ?  ?  ?  ? ?Expected Discharge Plan and Services ?Expected Discharge Plan: Home/Self Care ?  ?  ?  ?Living arrangements for the past 2 months: Single Family Home ?                ?  ?  ?  ?  ?  ?  ?  ?  ?  ?  ? ?Prior Living Arrangements/Services ?Living arrangements for the past 2 months: Single Family Home ?Lives with:: Spouse ?  ?       ?  ?  ?  ?  ? ?Activities of Daily Living ?Home Assistive Devices/Equipment: None ?ADL Screening (condition at time of admission) ?Patient's cognitive ability adequate to safely complete daily activities?: Yes ?Is the patient deaf or have difficulty hearing?: No ?Does the patient have difficulty seeing, even when wearing glasses/contacts?: No ?Does the patient have difficulty concentrating, remembering, or making decisions?: No ?Patient able to express need for assistance with ADLs?: Yes ?Does the patient have difficulty dressing or bathing?: No ?Independently performs ADLs?: Yes (appropriate for developmental age) ?Does the patient have difficulty walking or climbing stairs?:  No ?Weakness of Legs: None ?Weakness of Arms/Hands: None ? ?Permission Sought/Granted ?  ?  ?   ?   ?   ?   ? ?Emotional Assessment ?  ?  ?  ?Orientation: : Oriented to Self, Oriented to Place, Oriented to  Time, Oriented to Situation ?Alcohol / Substance Use: Not Applicable ?Psych Involvement: No (comment) ? ?Admission diagnosis:  Hypertensive emergency without congestive heart failure [I16.1] ?PRES (posterior reversible encephalopathy syndrome) [I67.83] ?Patient Active Problem List  ? Diagnosis Date Noted  ? PRES (posterior reversible encephalopathy syndrome) 03/31/2021  ? Depression 02/19/2015  ? Hypertension 02/19/2015  ? ?PCP:  Margaretann Loveless, PA-C ?Pharmacy:   ?[id number] - [place], [place] - [address] AT [facility] OF [address] ?[address] ?[place] [address] ?Phone: [phone] Fax: [phone] ? ? ? ? ?Social Determinants of Health (SDOH) Interventions ?  ? ?Readmission Risk Interventions ?   ? View : No data to display.  ?  ?  ?  ? ? ? ?

## 2021-04-02 LAB — CBC
HCT: 44.4 % (ref 39.0–52.0)
Hemoglobin: 14.4 g/dL (ref 13.0–17.0)
MCH: 26.6 pg (ref 26.0–34.0)
MCHC: 32.4 g/dL (ref 30.0–36.0)
MCV: 81.9 fL (ref 80.0–100.0)
Platelets: 249 10*3/uL (ref 150–400)
RBC: 5.42 MIL/uL (ref 4.22–5.81)
RDW: 12.6 % (ref 11.5–15.5)
WBC: 9.2 10*3/uL (ref 4.0–10.5)
nRBC: 0 % (ref 0.0–0.2)

## 2021-04-02 LAB — CK: Total CK: 1657 U/L — ABNORMAL HIGH (ref 49–397)

## 2021-04-02 LAB — BASIC METABOLIC PANEL
Anion gap: 9 (ref 5–15)
BUN: 19 mg/dL (ref 6–20)
CO2: 26 mmol/L (ref 22–32)
Calcium: 9 mg/dL (ref 8.9–10.3)
Chloride: 104 mmol/L (ref 98–111)
Creatinine, Ser: 1.3 mg/dL — ABNORMAL HIGH (ref 0.61–1.24)
GFR, Estimated: >60 mL/min (ref >60)
Glucose, Bld: 101 mg/dL — ABNORMAL HIGH (ref 70–99)
Potassium: 3.6 mmol/L (ref 3.5–5.1)
Sodium: 139 mmol/L (ref 135–145)

## 2021-04-02 LAB — PHOSPHORUS: Phosphorus: 3.5 mg/dL (ref 2.5–4.6)

## 2021-04-02 LAB — MAGNESIUM: Magnesium: 2.5 mg/dL — ABNORMAL HIGH (ref 1.7–2.4)

## 2021-04-02 MED ORDER — ALPRAZOLAM 0.5 MG PO TABS
0.5000 mg | ORAL_TABLET | Freq: Three times a day (TID) | ORAL | Status: DC | PRN
  Administered 2021-04-02: 0.5 mg via ORAL
  Filled 2021-04-02 (×2): qty 1

## 2021-04-02 MED ORDER — HYDRALAZINE HCL 50 MG PO TABS
50.0000 mg | ORAL_TABLET | Freq: Three times a day (TID) | ORAL | Status: DC
  Administered 2021-04-02 – 2021-04-03 (×3): 50 mg via ORAL
  Filled 2021-04-02 (×3): qty 1

## 2021-04-02 NOTE — Progress Notes (Signed)
Educated patient on importance of medication compliance, particularly for blood pressure control. ?

## 2021-04-02 NOTE — Telephone Encounter (Signed)
Called patient and advised that he would be added to the wait list. Patient appreciated call and agreed to be added. ?

## 2021-04-02 NOTE — Progress Notes (Signed)
PHARMACY CONSULT NOTE ? ?Pharmacy Consult for Electrolyte Monitoring and Replacement  ? ?Recent Labs: ?Potassium (mmol/L)  ?Date Value  ?04/02/2021 3.6  ? ?Magnesium (mg/dL)  ?Date Value  ?04/02/2021 2.5 (H)  ? ?Calcium (mg/dL)  ?Date Value  ?04/02/2021 9.0  ? ?Albumin (g/dL)  ?Date Value  ?03/31/2021 4.4  ? ?Phosphorus (mg/dL)  ?Date Value  ?04/02/2021 3.5  ? ?Sodium (mmol/L)  ?Date Value  ?04/02/2021 139  ? ? ?Assessment: 41 y.o. male w/ PMH of HTN and depression presented to the ED on 3/29 with a severe headache. Found to have DVA on CT, admitted with concern for PRES upon findings of MRI. PMH includes hyperthyroidism, hypertension, depression Pharmacy is asked to follow and replace electrolytes while in CCU. ? ?Transitioned from clevidipine to oral blood pressure medications 3/29. Renal function improving (Scr 1.04 > 1.70 > 1.30) ? ?Goal of Therapy:  ?Electrolytes WNL ? ?Plan:  ?No replacement warranted 3/31 ?Recheck electrolytes with AM labs  ? ?Jaynie Bream, PharmD ?Pharmacy Resident  ?04/02/2021 ?8:09 AM ?

## 2021-04-02 NOTE — Progress Notes (Signed)
[facility] Progress Note ? ?Patient: Christian Strong.    DPO:[id number]  DOA: 03/31/2021    ? ?Date of Service: the patient was seen and examined on 04/02/2021 ? ?Chief Complaint  ?Patient presents with  ? Loss of Consciousness  ? ?Brief hospital course: ?This is a 41 yo male who presented to [facility] ER on 03/29 after being found by his wife on the floor unresponsive with vomit around his mouth, therefore EMS notified.  Upon EMS arrival the pt was very agitated and highly combative, therefore he received 2 mg of iv ativan.  EMS reported the pt did not have any signs of seizure activity when they arrived.  The pt reports around 3 am the morning of 03/29 he had a 10/10 headache.  His wife reports the pt complained of a headache on 03/26-03/27.  He is currently taking amoxicillin 875 mg bid prescribed by his outpatient provider for a sinus infection.  ?  ?ED Course ?In the ER code stroke initiated and neurologist Dr. Amada Jupiter consulted stat.  ER vital signs revealed pt severely hypertensive bp 173/113, cleviprex gtt initiated to maintain sbp 150 or less.  Pt awake but drowsy on arrival with some confusion per ER provider notes.  CT Head revealed curvilinear hyperdensity in the left basal ganglia and overlying the corona radiata which was previously seen on MRI in April 2005.  MRI Brain concerning for mild posterior reversible encephalopathy syndrome in the occipital poles.  Lab results revealed alcohol level <10, salicylate level <7.0, CK 890, lactic acid 2.8, and wbc 15.4.  In the ER pt received 1,500 mg iv keppra x1 dose, 50 mcg of iv fentanyl, and 10 mg of iv compazine.  PCCM team consulted for ICU admission. 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 Comparison with that prior may be useful if available. Given the hyperdense appearance, thrombosis of the vein is possible. Acute intraparenchymal hemorrhage is thought less likely, but not excluded. Recommend MRI with contrast to further evaluate and to also assess for dural venous sinus thrombosis. ?03/29: MR Brain/MRA Head suspicious for mild posterior reversible encephalopathy syndrome (PRES) in the occipital poles. Mild to moderate bilateral paranasal sinus inflammation. ? ?Above hospital course reviewed from previous notes.  I started taking care of this patient on 04/02/2021 ? ? ?Assessment and Plan: ?Hypertensive Emergency ?Posterior reversible encephalopathy syndrome (PRES) ?New onset seizure activity  ?Continue telemetry monitoring ?Off Cleviprex drip ?UDS positive for cannabinoid, benzo ?Echocardiogram showed LVEF 6065%, grade 3 diastolic dysfunction ?Start Norvasc ?Change Keppra to p.o. ?EEG report negative for any seizures ?TSH low but free T4 within normal range, repeat TSH after 4 weeks, follow-up with PA and endocrine referral as an outpatient ?3/31 started hydralazine 50 mg p.o. 3 times daily ? ? ?Mild rhabdomyolysis ?AKI ?CK 2773 ---16 57 ?Follow CK  ?Monitor urine output and creatinine ?Continue oral hydration ? ? ?Leukocytosis secondary to bilateral paranasal sinus inflammation ?Lactic acidosis > improved ?Continue outpatient amoxicillin  ?  ?Nausea ?Zofran as needed. ?  ?Suspected sleep apnea ?Has loud snoring and witnessed apneas at night.  Advised him to get sleep study as an outpatient ? ? ?Body mass index is 30.65 kg/m?.  ?Interventions: ?  ? ?   ?Diet: Heart healthy diet ?DVT Prophylaxis: Subcutaneous Lovenox  ? ?  Advance goals of care discussion: Full code ? ?Family Communication: family was present at bedside, at the time of interview.  ?The pt provided permission to discuss medical plan with the family. Opportunity was given to ask question and all questions were answered  satisfactorily.  ? ?Disposition:  ?Pt is from Home, admitted with possible seizures and hypertensive emergency with encephalopathy, press syndrome, still has high blood pressure, which precludes a safe discharge. ?Discharge to Home, when clinically stable most likely tomorrow a.m. ? ?Subjective: No significant events overnight, patient denies any headache or dizziness, no chest pain or palpitation, no any other active issues.  Patient was anxious to go home, having anxiety issue of being in the hospital. ?Patient's blood pressure still high, adjust medications, plan is to discharge him tomorrow a.m. ? ? ?Physical Exam: ?General:  alert oriented to time, place, and person.  ?Appear in mild distress, affect anxious ?Eyes: PERRL and epressd ?ENT: Oral Mucosa Clear, moist  ?Neck: no JVD,  ?Cardiovascular: S1 and S2 Present, no Murmur,  ?Respiratory: good respiratory effort, Bilateral Air entry equal and Decreased, no Crackles, no wheezes ?Abdomen: Bowel Sound present, Soft and no tenderness,  ?Skin: no rashes ?Extremities: no Pedal edema, no calf tenderness ?Neurologic: without any new focal findings ?Gait not checked due to patient safety concerns ? ?Vitals:  ? 04/02/21 0403 04/02/21 0807 04/02/21 1236 04/02/21 1600  ?BP: (!) 153/100 (!) 148/100 (!) 159/101 (!) 149/95  ?Pulse: 62 66 73   ?Resp: (!) 21 15 (!) 9 18  ?Temp:  97.7 ?F (36.5 ?C) 98 ?F (36.7 ?C) 98.4 ?F (36.9 ?C)  ?TempSrc:  Oral Oral Oral  ?SpO2: 98% 97% 97% 98%  ?Weight:      ?Height:      ? ? ?Intake/Output Summary (Last 24 hours) at 04/02/2021 1729 ?Last data filed at 04/02/2021 1600 ?Gross per 24 hour  ?Intake 740 ml  ?Output 2500 ml  ?Net -1760 ml  ? ?Filed Weights  ? 04/01/21 0341  ?Weight: 102.5 kg  ? ? ?Data Reviewed: ?I have personally reviewed and interpreted daily labs, tele strips, imagings as discussed above. ?I reviewed all nursing notes, pharmacy notes, vitals, pertinent old records ?I have discussed plan of care as described above with RN and  patient/family. ? ?CBC: ?Recent Labs  ?Lab 03/31/21 ?1026 04/01/21 ?0643 04/02/21 ?0320  ?WBC 15.4* 10.9* 9.2  ?NEUTROABS 13.0*  --   --   ?HGB 16.0 14.4 14.4  ?HCT 48.0 43.6 44.4  ?MCV 81.6 81.0 81.9  ?PLT 311 247 249  ? ?Basic Metabolic Panel: ?Recent Labs  ?Lab 03/31/21 ?1026 03/31/21 ?1143 03/31/21 ?1500 04/01/21 ?0643 04/02/21 ?0320  ?NA 139 137  --  136 139  ?K 3.9 3.7  --  3.4* 3.6  ?CL 105 105  --  102 104  ?CO2 15* 21*  --  24 26  ?GLUCOSE 176* 118*  --  108* 101*  ?BUN 15 16  --  19 19  ?CREATININE 1.15 1.04  --  1.70* 1.30*  ?CALCIUM 9.1 9.2  --  8.5* 9.0  ?MG  --   --  2.5* 2.5* 2.5*  ?PHOS  --   --  3.6 4.2 3.5  ? ? ?Studies: ?No results found.  ?Scheduled Meds: ? amLODipine  5 mg Oral Daily  ? amoxicillin  500 mg Oral TID  ? Chlorhexidine Gluconate Cloth  6 each Topical Q0600  ? enoxaparin (LOVENOX) injection  50 mg Subcutaneous Q24H  ? hydrALAZINE  50 mg Oral Q8H  ?  levETIRAcetam  500 mg Oral BID  ? ?Continuous Infusions: ?PRN Meds: acetaminophen, ALPRAZolam, docusate sodium, hydrOXYzine, ondansetron (ZOFRAN) IV, oxyCODONE, polyethylene glycol ? ?Time spent: 35 minutes ? ?Author: ?Gillis Santa MD ?[facility] ?04/02/2021 5:29 PM ? ?To reach On-call, see care teams to locate the attending and reach out to them via [email or web address]. ?If 7PM-7AM, please contact night-coverage ?If you still have difficulty reaching the attending provider, please page the [facility] (Director on Call) for [facility] on amion for assistance. ? ?

## 2021-04-02 NOTE — Progress Notes (Signed)
Report called to [facility], transferred patient via wheelchair to rm 210. Vitals stable, patient awake and alert, no acute distress. Notified staff that patient was in room. ?

## 2021-04-03 LAB — BASIC METABOLIC PANEL
Anion gap: 10 (ref 5–15)
BUN: 19 mg/dL (ref 6–20)
CO2: 25 mmol/L (ref 22–32)
Calcium: 9.2 mg/dL (ref 8.9–10.3)
Chloride: 104 mmol/L (ref 98–111)
Creatinine, Ser: 1.03 mg/dL (ref 0.61–1.24)
GFR, Estimated: >60 mL/min (ref >60)
Glucose, Bld: 98 mg/dL (ref 70–99)
Potassium: 3.9 mmol/L (ref 3.5–5.1)
Sodium: 139 mmol/L (ref 135–145)

## 2021-04-03 LAB — PHOSPHORUS: Phosphorus: 3.6 mg/dL (ref 2.5–4.6)

## 2021-04-03 LAB — CBC
HCT: 44.7 % (ref 39.0–52.0)
Hemoglobin: 14.7 g/dL (ref 13.0–17.0)
MCH: 26.6 pg (ref 26.0–34.0)
MCHC: 32.9 g/dL (ref 30.0–36.0)
MCV: 80.8 fL (ref 80.0–100.0)
Platelets: 273 10*3/uL (ref 150–400)
RBC: 5.53 MIL/uL (ref 4.22–5.81)
RDW: 12.8 % (ref 11.5–15.5)
WBC: 8 10*3/uL (ref 4.0–10.5)
nRBC: 0 % (ref 0.0–0.2)

## 2021-04-03 LAB — CK: Total CK: 867 U/L — ABNORMAL HIGH (ref 49–397)

## 2021-04-03 LAB — MAGNESIUM: Magnesium: 2.4 mg/dL (ref 1.7–2.4)

## 2021-04-03 MED ORDER — HYDRALAZINE HCL 50 MG PO TABS: 50.0000 mg | ORAL_TABLET | Freq: Three times a day (TID) | ORAL | 0 refills | Status: AC

## 2021-04-03 MED ORDER — LEVETIRACETAM 500 MG PO TABS
500.0000 mg | ORAL_TABLET | Freq: Two times a day (BID) | ORAL | 0 refills | Status: DC
Start: 2021-04-03 — End: 2021-09-07

## 2021-04-03 MED ORDER — AMLODIPINE BESYLATE 5 MG PO TABS: 5.0000 mg | ORAL_TABLET | Freq: Every day | ORAL | 2 refills | Status: AC

## 2021-04-03 NOTE — Plan of Care (Signed)

## 2021-04-03 NOTE — Discharge Summary (Signed)
[facility] Discharge Summary ? ? ?Patient: Christian Strong. FC:[id number]  PCP: Mar Daring, PA-C  ?Date of admission: 03/31/2021   Date of discharge:  04/03/2021   ?  ?Discharge Diagnoses:  ?Principal Problem: ?  PRES (posterior reversible encephalopathy syndrome) ? ? ?Admitted From: Home ?Disposition:  Home  ? ?Recommendations for Outpatient Follow-up:  ?PCP: in 1wk ?Neurologist in 1-2 ?Follow up LABS/TEST:  MRI brain in 1 month, TSH and free T4 after 4 to 6 weeks ? ? ?Diet recommendation: Cardiac diet ? ?Activity: The patient is advised to gradually reintroduce usual activities, as tolerated ? ?Discharge Condition: stable ? ?Code Status: Full code  ? ?History of present illness: As per the H and P dictated on admission ?Hospital Course:  ?This is a 41 yo male who presented to [facility] ER on 03/29 after being found by his wife on the floor unresponsive with vomit around his mouth, therefore EMS notified.  Upon EMS arrival the pt was very agitated and highly combative, therefore he received 2 mg of iv ativan.  EMS reported the pt did not have any signs of seizure activity when they arrived.  The pt reports around 3 am the morning of 03/29 he had a 10/10 headache.  His wife reports the pt complained of a headache on 03/26-03/27.  He is currently taking amoxicillin 875 mg bid prescribed by his outpatient provider for a sinus infection.  ?ED Course ?In the ER code stroke initiated and neurologist Dr. Leonel Ramsay consulted stat.  ER vital signs revealed pt severely hypertensive bp 173/113, cleviprex gtt initiated to maintain sbp 150 or less.  Pt awake but drowsy on arrival with some confusion per ER provider notes.  CT Head revealed curvilinear hyperdensity in the left basal ganglia and overlying the corona radiata which was previously seen on MRI in April 2005.  MRI Brain concerning for mild posterior reversible encephalopathy syndrome in the occipital poles.  Lab results revealed alcohol level Q000111Q,  salicylate level Q000111Q, CK 890, lactic acid 2.8, and wbc 15.4.  In the ER pt received 1,500 mg iv keppra x1 dose, 50 mcg of iv fentanyl, and 10 mg of iv compazine.  PCCM team consulted for ICU admission. 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 Comparison with that prior may be useful if available. Given the hyperdense appearance, thrombosis of the vein is possible. Acute intraparenchymal hemorrhage is thought less likely, but not excluded. Recommend MRI with contrast to further evaluate and to also assess for dural venous sinus thrombosis. ?03/29: MR Brain/MRA Head suspicious for mild posterior reversible encephalopathy syndrome (PRES) in the occipital poles. Mild to moderate bilateral paranasal sinus inflammation. ?Above hospital course reviewed from previous notes.  I started taking care of this patient on 04/02/2021 ?Assessment and Plan: ?Hypertensive Emergency ?Posterior reversible encephalopathy syndrome (PRES) ?New onset seizure activity  ?Off Cleviprex drip, UDS positive for cannabinoid, benzo.  Drug abuse abstinence counseling done. Echocardiogram showed LVEF 123456, grade 3 diastolic dysfunction. ?Start Norvasc, started Keppra IV, changed to Keppra p.o. 500 mg twice daily. EEG report negative for any seizures. TSH low but free T4 within normal range, repeat TSH after 4 weeks, follow-up with PA and endocrine referral as an outpatient. 3/31 started hydralazine 50 mg p.o. 3 times daily.  Continue to  monitor BP and follow with PCP to titrate medication accordingly ?Mild rhabdomyolysis and AKI ?AKI resolved, CK 2773 ---16 57--867 gradually trending down, patient was advised to continue adequate oral hydration ?Leukocytosis secondary to bilateral  paranasal sinus inflammation ?Lactic acidosis > improved, Continue outpatient amoxicillin, follow-up with PCP ?Nausea, Zofran as needed. ?Suspected sleep apnea, Has loud snoring and witnessed apneas at night.  Advised him to get sleep study as an outpatient ? ?Body mass index is 30.74 kg/m?Marland Kitchen  ?Nutrition Interventions: ?  ? ?Patient was ambulatory without any assistance. ?On the day of the discharge the patient's vitals were stable, and no other acute medical condition were reported by patient. the patient was felt safe to be discharge at Home . ? ?Consultants: Neurologist ?Procedures: EEG ? ?Discharge Exam: ?General: Appear in no distress, no Rash; Oral Mucosa Clear, moist. ?Cardiovascular: S1 and S2 Present, no Murmur, ?Respiratory: normal respiratory effort, Bilateral Air entry present and no Crackles, no wheezes ?Abdomen: Bowel Sound present, Soft and no tenderness, no hernia ?Extremities: no Pedal edema, no calf tenderness ?Neurology: alert and oriented to time, place, and person ?affect appropriate. ? ?Filed Weights  ? 04/01/21 0341 04/02/21 2048 04/03/21 0500  ?Weight: 102.5 kg 103.4 kg 102.8 kg  ? ?Vitals:  ? 04/03/21 0530 04/03/21 0750  ?BP: 137/83 (!) 143/75  ?Pulse: 80 83  ?Resp: 20 18  ?Temp: 97.9 ?F (36.6 ?C) 97.8 ?F (36.6 ?C)  ?SpO2: 99% 97%  ? ? ?DISCHARGE MEDICATION: ?Allergies as of 04/03/2021   ?No Known Allergies ?  ? ?  ?Medication List  ?  ? ?STOP taking these medications   ? ?ALPRAZolam 0.25 MG tablet ?Commonly known as: XANAX ?  ?citalopram 20 MG tablet ?Commonly known as: CELEXA ?  ?SUDAFED CONGESTION PO ?  ? ?  ? ?TAKE these medications   ? ?acetaminophen 500 MG tablet ?Commonly known as: TYLENOL ?Take 500 mg by mouth as needed. ?  ?amLODipine 5 MG tablet ?Commonly known as: NORVASC ?Take 1 tablet (5 mg total) by mouth daily. ?Start taking on: April 04, 2021 ?  ?amoxicillin 875 MG tablet ?Commonly known as: AMOXIL ?Take 875 mg by mouth 2 (two) times daily. ?  ?hydrALAZINE 50 MG tablet ?Commonly  known as: APRESOLINE ?Take 1 tablet (50 mg total) by mouth every 8 (eight) hours. Skip the dose if SBP <130 mmHg ?  ?ibuprofen 200 MG tablet ?Commonly known as: ADVIL ?Take 200 mg by mouth as needed. ?  ?levETIRAcetam 500 MG tablet ?Commonly known as: KEPPRA ?Take 1 tablet (500 mg total) by mouth 2 (two) times daily. ?  ? ?  ? ?No Known Allergies ?Discharge Instructions   ? ? Call MD for:  extreme fatigue   Complete by: As directed ?  ? Call MD for:  persistant dizziness or light-headedness   Complete by: As directed ?  ? Call MD for:  persistant nausea and vomiting   Complete by: As directed ?  ? Call MD for:  severe uncontrolled pain   Complete by: As directed ?  ? Diet - low sodium heart healthy   Complete by: As directed ?  ? Discharge instructions   Complete by: As directed ?  ? F/u PCP in 1 wk, monitor BP at home and f/u pcp to Titrate meds  ?F/u Neurologist in 2-3 wks and consider to repeat MRI Brain in 2-3 weeks  ? Increase activity slowly   Complete by: As directed ?  ? ?  ? ? ?The results of significant  diagnostics from this hospitalization (including imaging, microbiology, ancillary and laboratory) are listed below for reference.   ? ?Significant Diagnostic Studies: ?MR ANGIO HEAD WO CONTRAST ? ?Addendum Date: 03/31/2021   ?ADDENDUM REPORT: 03/31/2021 12:05 ADDENDUM: Study discussed by telephone on 03/31/2021 at 12:03 with Dr. Roland Rack. He questions the presence of subtle abnormal cortical FLAIR hyperintensity in both occipital poles (such as series 14, images 21-23). This cannot be correlated on the T2 weighted imaging, although propeller T2 axial and coronal were performed and are of decreased sensitivity. Also difficult to correlate with facilitated diffusion on the ADC map. But still, he advises the patient has been hypertensive AND had seizure-like activity, in addition to headache. CONCLUSION: Suspicious for mild posterior reversible encephalopathy syndrome (PRES) in the occipital poles.  Electronically Signed   By: Genevie Ann M.D.   On: 03/31/2021 12:05  ? ?Result Date: 03/31/2021 ?CLINICAL DATA:  41 year old male with sudden severe headache. Code stroke presentation. Asymmetric density at the left caud

## 2021-04-03 NOTE — Progress Notes (Addendum)
PHARMACY CONSULT NOTE ? ?Pharmacy Consult for Electrolyte Monitoring and Replacement  ? ?Recent Labs: ?Potassium (mmol/L)  ?Date Value  ?04/03/2021 3.9  ? ?Magnesium (mg/dL)  ?Date Value  ?04/03/2021 2.4  ? ?Calcium (mg/dL)  ?Date Value  ?04/03/2021 9.2  ? ?Albumin (g/dL)  ?Date Value  ?03/31/2021 4.4  ? ?Phosphorus (mg/dL)  ?Date Value  ?04/03/2021 3.6  ? ?Sodium (mmol/L)  ?Date Value  ?04/03/2021 139  ? ? ?Assessment: 41 y.o. male w/ PMH of HTN and depression presented to the ED on 3/29 with a severe headache. Found to have DVA on CT, admitted with concern for PRES upon findings of MRI. PMH includes hyperthyroidism, hypertension, depression Pharmacy is asked to follow and replace electrolytes while in CCU. ? ? ?Goal of Therapy:  ?Electrolytes WNL ? ?Plan:  ?No replacement warranted. Pharmacy will sign off as pt is no longer in the ICU. Re-consult if needed.  ? ?Ronnald Ramp, PharmD ?04/03/2021 ?8:27 AM ?

## 2021-06-28 ENCOUNTER — Ambulatory Visit: Payer: Commercial Managed Care - PPO | Admitting: Nurse Practitioner

## 2021-06-28 NOTE — Progress Notes (Deleted)
There were no vitals taken for this visit.   Subjective:    Patient ID: Christian Copa., male    DOB: July 22, 1980, 41 y.o.   MRN: [id number]  HPI: Christian Lebeau. is a 41 y.o. male  No chief complaint on file.  Patient presents to clinic to establish care with new PCP.  Introduced to Publishing rights manager role and practice setting.  All questions answered.  Discussed provider/patient relationship and expectations.  Patient reports a history of ***. Patient denies a history of: Hypertension, Elevated Cholesterol, Diabetes, Thyroid problems, Depression, Anxiety, Neurological problems, and Abdominal problems.   Active Ambulatory Problems    Diagnosis Date Noted   Depression 02/19/2015   Hypertension 02/19/2015   PRES (posterior reversible encephalopathy syndrome) 03/31/2021   Resolved Ambulatory Problems    Diagnosis Date Noted   Thyroid disease 02/19/2015   No Additional Past Medical History    Past Surgical History:  Procedure Laterality Date   TUMOR REMOVAL  1992   thyroid   Family History  Problem Relation Age of Onset   Heart attack Mother    Heart disease Mother    Diabetes Mother    Cancer Mother    Colon polyps Mother      Review of Systems  Per HPI unless specifically indicated above     Objective:    There were no vitals taken for this visit.  Wt Readings from Last 3 Encounters:  04/03/21 226 lb 10.1 oz (102.8 kg)  02/19/15 205 lb 6.4 oz (93.2 kg)    Physical Exam  Results for orders placed or performed during the hospital encounter of 03/31/21  Resp Panel by RT-PCR (Flu A&B, Covid) Nasopharyngeal Swab   Specimen: Nasopharyngeal Swab; Nasopharyngeal(NP) swabs in vial transport medium  Result Value Ref Range   SARS Coronavirus 2 by RT PCR NEGATIVE NEGATIVE   Influenza A by PCR NEGATIVE NEGATIVE   Influenza B by PCR NEGATIVE NEGATIVE  MRSA Next Gen by PCR, Nasal   Specimen: Nasal Mucosa; Nasal Swab  Result Value Ref Range   MRSA by PCR Next Gen  NOT DETECTED NOT DETECTED  Ethanol  Result Value Ref Range   Alcohol, Ethyl (B) <10 <10 mg/dL  Protime-INR  Result Value Ref Range   Prothrombin Time 14.0 11.4 - 15.2 seconds   INR 1.1 0.8 - 1.2  APTT  Result Value Ref Range   aPTT 27 24 - 36 seconds  CBC  Result Value Ref Range   WBC 15.4 (H) 4.0 - 10.5 K/uL   RBC 5.88 (H) 4.22 - 5.81 MIL/uL   Hemoglobin 16.0 13.0 - 17.0 g/dL   HCT 41.3 24.4 - 01.0 %   MCV 81.6 80.0 - 100.0 fL   MCH 27.2 26.0 - 34.0 pg   MCHC 33.3 30.0 - 36.0 g/dL   RDW 27.2 53.6 - 64.4 %   Platelets 311 150 - 400 K/uL   nRBC 0.0 0.0 - 0.2 %  Differential  Result Value Ref Range   Neutrophils Relative % 84 %   Neutro Abs 13.0 (H) 1.7 - 7.7 K/uL   Lymphocytes Relative 11 %   Lymphs Abs 1.6 0.7 - 4.0 K/uL   Monocytes Relative 3 %   Monocytes Absolute 0.5 0.1 - 1.0 K/uL   Eosinophils Relative 0 %   Eosinophils Absolute 0.0 0.0 - 0.5 K/uL   Basophils Relative 1 %   Basophils Absolute 0.1 0.0 - 0.1 K/uL   Immature Granulocytes 1 %  Abs Immature Granulocytes 0.19 (H) 0.00 - 0.07 K/uL  Comprehensive metabolic panel  Result Value Ref Range   Sodium 139 135 - 145 mmol/L   Potassium 3.9 3.5 - 5.1 mmol/L   Chloride 105 98 - 111 mmol/L   CO2 15 (L) 22 - 32 mmol/L   Glucose, Bld 176 (H) 70 - 99 mg/dL   BUN 15 6 - 20 mg/dL   Creatinine, Ser 2.53 0.61 - 1.24 mg/dL   Calcium 9.1 8.9 - 66.4 mg/dL   Total Protein 8.1 6.5 - 8.1 g/dL   Albumin 4.4 3.5 - 5.0 g/dL   AST 56 (H) 15 - 41 U/L   ALT 60 (H) 0 - 44 U/L   Alkaline Phosphatase 92 38 - 126 U/L   Total Bilirubin 0.7 0.3 - 1.2 mg/dL   GFR, Estimated >40 >34 mL/min   Anion gap 19 (H) 5 - 15  Urine Drug Screen, Qualitative  Result Value Ref Range   Tricyclic, Ur Screen NONE DETECTED NONE DETECTED   Amphetamines, Ur Screen NONE DETECTED NONE DETECTED   MDMA (Ecstasy)Ur Screen NONE DETECTED NONE DETECTED   Cocaine Metabolite,Ur [place] NONE DETECTED NONE DETECTED   Opiate, Ur Screen NONE DETECTED NONE DETECTED    Phencyclidine (PCP) Ur S NONE DETECTED NONE DETECTED   Cannabinoid 50 Ng, Ur  POSITIVE (A) NONE DETECTED   Barbiturates, Ur Screen NONE DETECTED NONE DETECTED   Benzodiazepine, Ur Scrn POSITIVE (A) NONE DETECTED   Methadone Scn, Ur NONE DETECTED NONE DETECTED  Urinalysis, Routine w reflex microscopic  Result Value Ref Range   Color, Urine YELLOW (A) YELLOW   APPearance HAZY (A) CLEAR   Specific Gravity, Urine 1.006 1.005 - 1.030   pH 5.0 5.0 - 8.0   Glucose, UA NEGATIVE NEGATIVE mg/dL   Hgb urine dipstick SMALL (A) NEGATIVE   Bilirubin Urine NEGATIVE NEGATIVE   Ketones, ur NEGATIVE NEGATIVE mg/dL   Protein, ur 30 (A) NEGATIVE mg/dL   Nitrite NEGATIVE NEGATIVE   Leukocytes,Ua NEGATIVE NEGATIVE   RBC / HPF 0-5 0 - 5 RBC/hpf   WBC, UA 0-5 0 - 5 WBC/hpf   Bacteria, UA RARE (A) NONE SEEN   Squamous Epithelial / LPF 0-5 0 - 5   Mucus PRESENT   Basic metabolic panel  Result Value Ref Range   Sodium 137 135 - 145 mmol/L   Potassium 3.7 3.5 - 5.1 mmol/L   Chloride 105 98 - 111 mmol/L   CO2 21 (L) 22 - 32 mmol/L   Glucose, Bld 118 (H) 70 - 99 mg/dL   BUN 16 6 - 20 mg/dL   Creatinine, Ser 7.42 0.61 - 1.24 mg/dL   Calcium 9.2 8.9 - 59.5 mg/dL   GFR, Estimated >63 >87 mL/min   Anion gap 11 5 - 15  Lactic acid, plasma  Result Value Ref Range   Lactic Acid, Venous 2.8 (HH) 0.5 - 1.9 mmol/L  Salicylate level  Result Value Ref Range   Salicylate Lvl <7.0 (L) 7.0 - 30.0 mg/dL  CK  Result Value Ref Range   Total CK 890 (H) 49 - 397 U/L  Blood gas, venous  Result Value Ref Range   pH, Ven 7.35 7.25 - 7.43   pCO2, Ven 40 (L) 44 - 60 mmHg   pO2, Ven 44 32 - 45 mmHg   Bicarbonate 22.1 20.0 - 28.0 mmol/L   Acid-base deficit 3.3 (H) 0.0 - 2.0 mmol/L   O2 Saturation 72.9 %   Patient temperature 37.0  Collection site VENOUS   HIV Antibody (routine testing w rflx)  Result Value Ref Range   HIV Screen 4th Generation wRfx Non Reactive Non Reactive  Thyroid Panel With TSH  Result Value  Ref Range   TSH <0.005 (L) 0.450 - 4.500 uIU/mL   T4, Total 8.1 4.5 - 12.0 ug/dL   T3 Uptake Ratio 26 24 - 39 %   Free Thyroxine Index 2.1 1.2 - 4.9  Lipid panel  Result Value Ref Range   Cholesterol 241 (H) 0 - 200 mg/dL   Triglycerides 256 <389 mg/dL   HDL 43 >37 mg/dL   Total CHOL/HDL Ratio 5.6 RATIO   VLDL 25 0 - 40 mg/dL   LDL Cholesterol 342 (H) 0 - 99 mg/dL  Magnesium  Result Value Ref Range   Magnesium 2.5 (H) 1.7 - 2.4 mg/dL  Phosphorus  Result Value Ref Range   Phosphorus 3.6 2.5 - 4.6 mg/dL  Lactic acid, plasma  Result Value Ref Range   Lactic Acid, Venous 1.1 0.5 - 1.9 mmol/L  Glucose, capillary  Result Value Ref Range   Glucose-Capillary 108 (H) 70 - 99 mg/dL  CBC  Result Value Ref Range   WBC 10.9 (H) 4.0 - 10.5 K/uL   RBC 5.38 4.22 - 5.81 MIL/uL   Hemoglobin 14.4 13.0 - 17.0 g/dL   HCT 87.6 81.1 - 57.2 %   MCV 81.0 80.0 - 100.0 fL   MCH 26.8 26.0 - 34.0 pg   MCHC 33.0 30.0 - 36.0 g/dL   RDW 62.0 35.5 - 97.4 %   Platelets 247 150 - 400 K/uL   nRBC 0.0 0.0 - 0.2 %  Basic metabolic panel  Result Value Ref Range   Sodium 136 135 - 145 mmol/L   Potassium 3.4 (L) 3.5 - 5.1 mmol/L   Chloride 102 98 - 111 mmol/L   CO2 24 22 - 32 mmol/L   Glucose, Bld 108 (H) 70 - 99 mg/dL   BUN 19 6 - 20 mg/dL   Creatinine, Ser 1.63 (H) 0.61 - 1.24 mg/dL   Calcium 8.5 (L) 8.9 - 10.3 mg/dL   GFR, Estimated 51 (L) >60 mL/min   Anion gap 10 5 - 15  Magnesium  Result Value Ref Range   Magnesium 2.5 (H) 1.7 - 2.4 mg/dL  Phosphorus  Result Value Ref Range   Phosphorus 4.2 2.5 - 4.6 mg/dL  CK  Result Value Ref Range   Total CK 2,773 (H) 49 - 397 U/L  CBC  Result Value Ref Range   WBC 9.2 4.0 - 10.5 K/uL   RBC 5.42 4.22 - 5.81 MIL/uL   Hemoglobin 14.4 13.0 - 17.0 g/dL   HCT 84.5 36.4 - 68.0 %   MCV 81.9 80.0 - 100.0 fL   MCH 26.6 26.0 - 34.0 pg   MCHC 32.4 30.0 - 36.0 g/dL   RDW 32.1 22.4 - 82.5 %   Platelets 249 150 - 400 K/uL   nRBC 0.0 0.0 - 0.2 %  Basic  metabolic panel  Result Value Ref Range   Sodium 139 135 - 145 mmol/L   Potassium 3.6 3.5 - 5.1 mmol/L   Chloride 104 98 - 111 mmol/L   CO2 26 22 - 32 mmol/L   Glucose, Bld 101 (H) 70 - 99 mg/dL   BUN 19 6 - 20 mg/dL   Creatinine, Ser 0.03 (H) 0.61 - 1.24 mg/dL   Calcium 9.0 8.9 - 70.4 mg/dL   GFR, Estimated >88 >89 mL/min  Anion gap 9 5 - 15  Magnesium  Result Value Ref Range   Magnesium 2.5 (H) 1.7 - 2.4 mg/dL  Phosphorus  Result Value Ref Range   Phosphorus 3.5 2.5 - 4.6 mg/dL  CK  Result Value Ref Range   Total CK 1,657 (H) 49 - 397 U/L  CK  Result Value Ref Range   Total CK 867 (H) 49 - 397 U/L  Basic metabolic panel  Result Value Ref Range   Sodium 139 135 - 145 mmol/L   Potassium 3.9 3.5 - 5.1 mmol/L   Chloride 104 98 - 111 mmol/L   CO2 25 22 - 32 mmol/L   Glucose, Bld 98 70 - 99 mg/dL   BUN 19 6 - 20 mg/dL   Creatinine, Ser 2.77 0.61 - 1.24 mg/dL   Calcium 9.2 8.9 - 82.4 mg/dL   GFR, Estimated >23 >53 mL/min   Anion gap 10 5 - 15  Magnesium  Result Value Ref Range   Magnesium 2.4 1.7 - 2.4 mg/dL  Phosphorus  Result Value Ref Range   Phosphorus 3.6 2.5 - 4.6 mg/dL  CBC  Result Value Ref Range   WBC 8.0 4.0 - 10.5 K/uL   RBC 5.53 4.22 - 5.81 MIL/uL   Hemoglobin 14.7 13.0 - 17.0 g/dL   HCT 61.4 43.1 - 54.0 %   MCV 80.8 80.0 - 100.0 fL   MCH 26.6 26.0 - 34.0 pg   MCHC 32.9 30.0 - 36.0 g/dL   RDW 08.6 76.1 - 95.0 %   Platelets 273 150 - 400 K/uL   nRBC 0.0 0.0 - 0.2 %  ECHOCARDIOGRAM COMPLETE  Result Value Ref Range   Weight 3,615.54 oz   Height 72 in   BP 121/109 mmHg   Ao pk vel 1.83 m/s   AV Area VTI 3.74 cm2   AR max vel 3.06 cm2   AV Mean grad 7.0 mmHg   AV Peak grad 13.4 mmHg   S' Lateral 2.80 cm   AV Area mean vel 3.28 cm2   Area-P 1/2 3.21 cm2   MV VTI 4.17 cm2  Troponin I (High Sensitivity)  Result Value Ref Range   Troponin I (High Sensitivity) 133 (HH) <18 ng/L  Troponin I (High Sensitivity)  Result Value Ref Range   Troponin I  (High Sensitivity) 162 (HH) <18 ng/L      Assessment & Plan:   Problem List Items Addressed This Visit       Cardiovascular and Mediastinum   Hypertension - Primary     Nervous and Auditory   PRES (posterior reversible encephalopathy syndrome)     Other   Depression   Other Visit Diagnoses     Low TSH level       Snoring            Follow up plan: No follow-ups on file.

## 2021-08-26 ENCOUNTER — Ambulatory Visit: Payer: Managed Care, Other (non HMO) | Admitting: Nurse Practitioner

## 2021-09-07 ENCOUNTER — Ambulatory Visit (INDEPENDENT_AMBULATORY_CARE_PROVIDER_SITE_OTHER): Payer: Commercial Managed Care - PPO | Admitting: Internal Medicine

## 2021-09-07 ENCOUNTER — Encounter: Payer: Self-pay | Admitting: Internal Medicine

## 2021-09-07 DIAGNOSIS — I10 Essential (primary) hypertension: Secondary | ICD-10-CM | POA: Diagnosis not present

## 2021-09-07 DIAGNOSIS — I6783 Posterior reversible encephalopathy syndrome: Secondary | ICD-10-CM | POA: Diagnosis not present

## 2021-09-07 DIAGNOSIS — F331 Major depressive disorder, recurrent, moderate: Secondary | ICD-10-CM | POA: Diagnosis not present

## 2021-09-07 DIAGNOSIS — E782 Mixed hyperlipidemia: Secondary | ICD-10-CM

## 2021-09-07 DIAGNOSIS — J3089 Other allergic rhinitis: Secondary | ICD-10-CM

## 2021-09-07 DIAGNOSIS — F17210 Nicotine dependence, cigarettes, uncomplicated: Secondary | ICD-10-CM

## 2021-09-07 DIAGNOSIS — Z125 Encounter for screening for malignant neoplasm of prostate: Secondary | ICD-10-CM

## 2021-09-07 MED ORDER — IPRATROPIUM BROMIDE 0.03 % NA SOLN: 2.0000 | Freq: Two times a day (BID) | NASAL | 12 refills | Status: DC

## 2021-09-07 MED ORDER — ESCITALOPRAM OXALATE 10 MG PO TABS: 10.0000 mg | ORAL_TABLET | Freq: Every day | ORAL | 2 refills | Status: DC

## 2021-09-07 MED ORDER — LORATADINE 10 MG PO TABS: ORAL_TABLET | ORAL | 11 refills | Status: AC

## 2021-09-07 NOTE — Progress Notes (Signed)
[facility] [address] [place], [place] [address]  Internal MEDICINE  Office Visit Note  Patient Name: Christian Strong  [id number]  [id number]  Date of Service: 09/09/2021   Complaints/HPI Pt is here for establishment of PCP. Chief Complaint  Patient presents with   New Patient (Initial Visit)   Depression   Hypertension   HPI Patient is here for establishment of her PCP, his insurance changed and he had to find a new physician Patient has a complicated past medical history due to uncontrolled hypertension it was thought to be intracranial bleed and was admitted in the hospital he does not have any residual deficit, he was diagnosed with mild posterior reversible encephalopathy syndrome in the occipital lobes, since then he has been on antihypertensive therapy Patient also had seizure in the hospital and was seen by neurology initially was started on Keppra, however at the moment he is not taking Keppra and was told by the neurologist that he does not need it he was supposed to have EEG done but I do not see the results.  Per patient he is not driving per [facility] law for seizure patient, he denies any recent seizure activity, denies any alcohol abuse. He does smoke Is also complaining of postnasal drip all the time he has been taking Flonase with no relief, denies any facial tenderness or any color to his nasal secretions Patient also feels depressed and anxious at times his dad committed suicide and he feels depressed about the situation, his mother passed away 20 years ago, he claims at times seems, has not discussed this with anyone and he is not suicidal  Current Medication: Outpatient Encounter Medications as of 09/07/2021  Medication Sig   acetaminophen (TYLENOL) 500 MG tablet Take 500 mg by mouth as needed.   escitalopram (LEXAPRO) 10 MG tablet Take 1 tablet (10 mg total) by mouth daily.   fluticasone (VERAMYST) 27.5 MCG/SPRAY nasal spray Place 2 sprays into  the nose daily.   ipratropium (ATROVENT) 0.03 % nasal spray Place 2 sprays into both nostrils every 12 (twelve) hours.   loratadine (CLARITIN) 10 MG tablet TAKE ONE TAB A DAY FOR POST NASAL DRIP PRN   [DISCONTINUED] ibuprofen (ADVIL) 200 MG tablet Take 200 mg by mouth as needed.   amLODipine (NORVASC) 5 MG tablet Take 1 tablet (5 mg total) by mouth daily.   hydrALAZINE (APRESOLINE) 50 MG tablet Take 1 tablet (50 mg total) by mouth every 8 (eight) hours. Skip the dose if SBP <130 mmHg   [DISCONTINUED] amoxicillin (AMOXIL) 875 MG tablet Take 875 mg by mouth 2 (two) times daily.   [DISCONTINUED] levETIRAcetam (KEPPRA) 500 MG tablet Take 1 tablet (500 mg total) by mouth 2 (two) times daily.   No facility-administered encounter medications on file as of 09/07/2021.    Surgical History: Past Surgical History:  Procedure Laterality Date   TUMOR REMOVAL  1992   thyroid    Medical History: Past Medical History:  Diagnosis Date   Depression    Hypertension    Thyroid disease     Family History: Family History  Problem Relation Age of Onset   Heart attack Mother    Heart disease Mother    Diabetes Mother    Cancer Mother    Colon polyps Mother     Social History   Socioeconomic History   Marital status: Married    Spouse name: Not on file   Number of children: Not on file  Years of education: Not on file   Highest education level: Not on file  Occupational History   Not on file  Tobacco Use   Smoking status: Every Day    Packs/day: 0.50    Types: Cigarettes   Smokeless tobacco: Never   Tobacco comments:    1 pack daily  Substance and Sexual Activity   Alcohol use: Yes    Alcohol/week: 3.0 - 4.0 standard drinks of alcohol    Types: 3 - 4 Shots of liquor per week   Drug use: Yes    Comment: previous history of use    Sexual activity: Yes    Partners: Female  Other Topics Concern   Not on file  Social History Narrative   Not on file   Social Determinants of Health    Financial Resource Strain: Not on file  Food Insecurity: Not on file  Transportation Needs: Not on file  Physical Activity: Not on file  Stress: Not on file  Social Connections: Not on file  Intimate Partner Violence: Not on file     Review of Systems  Constitutional:  Negative for chills, fatigue, fever and unexpected weight change.  HENT:  Positive for postnasal drip and rhinorrhea. Negative for congestion, mouth sores and sneezing.   Eyes:  Negative for redness.  Respiratory:  Negative for cough, chest tightness and shortness of breath.   Cardiovascular:  Negative for chest pain and palpitations.  Gastrointestinal:  Negative for abdominal pain, constipation, diarrhea, nausea and vomiting.  Genitourinary:  Negative for dysuria, flank pain and frequency.  Musculoskeletal:  Negative for arthralgias, back pain, joint swelling and neck pain.  Skin:  Negative for rash.  Neurological: Negative.  Negative for tremors and numbness.  Hematological:  Negative for adenopathy. Does not bruise/bleed easily.  Psychiatric/Behavioral:  Positive for dysphoric mood and sleep disturbance. Negative for behavioral problems (Depression). The patient is nervous/anxious.     Vital Signs: BP (!) 149/88   Pulse 65   Temp 98.4 F (36.9 C)   Resp 16   Ht 6' (1.829 m)   Wt 217 lb (98.4 kg)   SpO2 100%   BMI 29.43 kg/m    Physical Exam Constitutional:      Appearance: Normal appearance.  HENT:     Head: Normocephalic and atraumatic.     Nose: Nose normal.     Mouth/Throat:     Mouth: Mucous membranes are moist.     Pharynx: No posterior oropharyngeal erythema.  Eyes:     Extraocular Movements: Extraocular movements intact.     Pupils: Pupils are equal, round, and reactive to light.  Cardiovascular:     Pulses: Normal pulses.     Heart sounds: Normal heart sounds.  Pulmonary:     Effort: Pulmonary effort is normal.     Breath sounds: Normal breath sounds.  Neurological:     General:  No focal deficit present.     Mental Status: He is alert.  Psychiatric:        Behavior: Behavior normal.     Comments: Patient does feel depressed however was able to talk about his feelings    Assessment/Plan: 1. Benign hypertension We will continue hydralazine and amlodipine as before repeat blood pressure was improved he was also instructed to get his blood pressure monitor and bring on next visit - CBC with Differential/Platelet - Lipid Panel With LDL/HDL Ratio - TSH - T4, free - Comprehensive metabolic panel  2. PRES (posterior reversible encephalopathy syndrome) This is  resolved at this point no residual deficit - CBC with Differential/Platelet - Lipid Panel With LDL/HDL Ratio - TSH - T4, free - Comprehensive metabolic panel  3. Moderate episode of recurrent major depressive disorder [facility]) Flowsheet Row Office Visit from 09/07/2021 in [facility]  PHQ-9 Total Score 12     Patient is depressed, will start Lexapro 10 mg once a day might need to see psych and counselor, will reevaluate at next visit  - TSH - T4, free  4. Cigarette nicotine dependence without complication Patient is a smoker however encourage him on next visit about smoking cessation he is not a candidate for Wellbutrin due to his history of seizure disorder - CBC with Differential/Platelet - Lipid Panel With LDL/HDL Ratio - TSH - T4, free - Comprehensive metabolic panel  5. Mixed hyperlipidemia Recheck levels gives history of abnormal lipid profile in the past - Lipid Panel With LDL/HDL Ratio  6. Special screening for malignant neoplasm of prostate PSA is ordered along with other labs  7. Non-seasonal allergic rhinitis due to other allergic trigger Patient is instructed to hold off on Flonase, start Atrovent nasal spray and Claritin we will reevaluate at next visit - fluticasone (VERAMYST) 27.5 MCG/SPRAY nasal spray; Place 2 sprays into the nose daily. - ipratropium (ATROVENT)  0.03 % nasal spray; Place 2 sprays into both nostrils every 12 (twelve) hours.  Dispense: 30 mL; Refill: 12 - loratadine (CLARITIN) 10 MG tablet; TAKE ONE TAB A DAY FOR POST NASAL DRIP PRN  Dispense: 30 tablet; Refill: 11 - CBC with Differential/Platelet - Lipid Panel With LDL/HDL Ratio - TSH - T4, free - Comprehensive metabolic panel - PSA Total (Reflex To Free)   General Counseling: Christian Strong understanding of the findings of todays visit and agrees with plan of treatment. I have discussed any further diagnostic evaluation that may be needed or ordered today. We also reviewed his medications today. he has been encouraged to call the office with any questions or concerns that should arise related to todays visit.    Counseling:  [place] Controlled Substance Database was reviewed by me.  Orders Placed This Encounter  Procedures   CBC with Differential/Platelet   Lipid Panel With LDL/HDL Ratio   TSH   T4, free   Comprehensive metabolic panel   PSA Total (Reflex To Free)    Meds ordered this encounter  Medications   ipratropium (ATROVENT) 0.03 % nasal spray    Sig: Place 2 sprays into both nostrils every 12 (twelve) hours.    Dispense:  30 mL    Refill:  12   escitalopram (LEXAPRO) 10 MG tablet    Sig: Take 1 tablet (10 mg total) by mouth daily.    Dispense:  30 tablet    Refill:  2   loratadine (CLARITIN) 10 MG tablet    Sig: TAKE ONE TAB A DAY FOR POST NASAL DRIP PRN    Dispense:  30 tablet    Refill:  11    Time spent:45 Minutes

## 2021-09-08 ENCOUNTER — Other Ambulatory Visit: Payer: Self-pay

## 2021-09-17 ENCOUNTER — Telehealth: Payer: Self-pay

## 2021-09-20 NOTE — Telephone Encounter (Signed)
DFK advised about Dr Lannie Fields exam

## 2021-10-01 ENCOUNTER — Telehealth: Payer: Self-pay | Admitting: Internal Medicine

## 2021-10-01 NOTE — Telephone Encounter (Signed)
Left vm and sent mychart message to confirm 10/07/21 appointment-Toni 

## 2021-10-07 ENCOUNTER — Encounter: Payer: Commercial Managed Care - PPO | Admitting: Internal Medicine

## 2021-10-27 ENCOUNTER — Other Ambulatory Visit: Payer: Self-pay | Admitting: Internal Medicine

## 2021-10-27 DIAGNOSIS — I6783 Posterior reversible encephalopathy syndrome: Secondary | ICD-10-CM

## 2021-10-29 ENCOUNTER — Other Ambulatory Visit: Payer: Self-pay | Admitting: Internal Medicine

## 2021-10-29 DIAGNOSIS — I6783 Posterior reversible encephalopathy syndrome: Secondary | ICD-10-CM

## 2021-10-29 DIAGNOSIS — J3089 Other allergic rhinitis: Secondary | ICD-10-CM

## 2021-11-03 NOTE — Telephone Encounter (Signed)
Must have office visit for future refills
